# Patient Record
Sex: Female | Born: 1990 | Race: White | Hispanic: No | Marital: Single | Smoking: Never smoker
Health system: Southern US, Community
[De-identification: ages and names within clinical notes are randomized; demographics above are authoritative.]

## PROBLEM LIST (undated history)

## (undated) DIAGNOSIS — N390 Urinary tract infection, site not specified: Secondary | ICD-10-CM

## (undated) DIAGNOSIS — K921 Melena: Secondary | ICD-10-CM

## (undated) DIAGNOSIS — R519 Headache, unspecified: Secondary | ICD-10-CM

## (undated) DIAGNOSIS — R51 Headache: Secondary | ICD-10-CM

## (undated) DIAGNOSIS — G43909 Migraine, unspecified, not intractable, without status migrainosus: Secondary | ICD-10-CM

## (undated) HISTORY — DX: Migraine, unspecified, not intractable, without status migrainosus: G43.909

## (undated) HISTORY — DX: Urinary tract infection, site not specified: N39.0

## (undated) HISTORY — PX: WISDOM TOOTH EXTRACTION: SHX21

## (undated) HISTORY — DX: Melena: K92.1

## (undated) HISTORY — DX: Headache: R51

## (undated) HISTORY — DX: Headache, unspecified: R51.9

---

## 2015-11-27 ENCOUNTER — Telehealth: Payer: Self-pay | Admitting: *Deleted

## 2015-11-27 NOTE — Telephone Encounter (Signed)
Unable to reach patient at time of Pre-Visit Call. Line busy, unable to leave message. 

## 2015-11-28 ENCOUNTER — Ambulatory Visit (INDEPENDENT_AMBULATORY_CARE_PROVIDER_SITE_OTHER): Payer: Managed Care, Other (non HMO) | Admitting: Family Medicine

## 2015-11-28 ENCOUNTER — Encounter: Payer: Self-pay | Admitting: Family Medicine

## 2015-11-28 VITALS — BP 120/63 | HR 89 | Temp 98.6°F | Ht 65.0 in | Wt 225.4 lb

## 2015-11-28 DIAGNOSIS — H9319 Tinnitus, unspecified ear: Secondary | ICD-10-CM | POA: Insufficient documentation

## 2015-11-28 DIAGNOSIS — Z1329 Encounter for screening for other suspected endocrine disorder: Secondary | ICD-10-CM | POA: Diagnosis not present

## 2015-11-28 DIAGNOSIS — H93A9 Pulsatile tinnitus, unspecified ear: Secondary | ICD-10-CM

## 2015-11-28 DIAGNOSIS — Z13220 Encounter for screening for lipoid disorders: Secondary | ICD-10-CM | POA: Diagnosis not present

## 2015-11-28 DIAGNOSIS — R1012 Left upper quadrant pain: Secondary | ICD-10-CM | POA: Diagnosis not present

## 2015-11-28 DIAGNOSIS — H9311 Tinnitus, right ear: Secondary | ICD-10-CM

## 2015-11-28 DIAGNOSIS — E663 Overweight: Secondary | ICD-10-CM

## 2015-11-28 DIAGNOSIS — Z130 Encounter for screening for diseases of the blood and blood-forming organs and certain disorders involving the immune mechanism: Secondary | ICD-10-CM

## 2015-11-28 DIAGNOSIS — Z131 Encounter for screening for diabetes mellitus: Secondary | ICD-10-CM

## 2015-11-28 NOTE — Patient Instructions (Addendum)
I will refer you to ENT to look at the ringing in your ears. Also we will check labs for you today- I will be in touch with your results asap.  It is ok to start an exercise program but start slow and easy until you gain fitness!

## 2015-11-28 NOTE — Progress Notes (Signed)
Pre visit review using our clinic review tool, if applicable. No additional management support is needed unless otherwise documented below in the visit note. 

## 2015-11-28 NOTE — Progress Notes (Signed)
[facility] [address], [address] [place], [place] [id number] [phone]  Date:  11/28/2015   Name:  Joanna Pena Orf   DOB:  07/19/1990   MRN:  [id number]  PCP:  Abbe AmsterdamOPLAND,JESSICA, MD    Chief Complaint: Establish Care   History of Present Illness:  Joanna Pena Corman is a 25 y.o. very pleasant female patient who presents with the following: She would like to establish care- she had not seen a doctor in a long time.   She wants to "get healthy and start exercising" and wants to be sure all is well first   She has noted a ringing in her right ear for 6 months and now a whooshing sound that pulses in the right ear for 4-5 months.     She has had "stomach issues" for 8 years, dx with a hiatal hernia at some point in the past. .  4-5 years ago she had some labs and an US but is not sure of the results.  This was done in [place]. Her sx have gotten better over the last couple of years. She describes a hard feeling in her LUQ after eating some of the time. It is not present now and has improved over the last 2 years.   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    She has been otherwise generally healthy   No recent labs.   She does notice periodic  GERD- she will use tums OTC prn She has not yet had a pap- but she is a virgin and does not feel all that comfortable having the pap done Family history of thyroid issues   There are no active problems to display for this patient.   No past medical history on file.  No past surgical history on file.  Social History  Substance Use Topics  . Smoking status: Not on file  . Smokeless tobacco: Not on file  . Alcohol Use: Not on file    No family history on file.  Allergies not on file  Medication list has been reviewed and updated.  No current outpatient  prescriptions on file prior to visit.   No current facility-administered medications on file prior to visit.    Review of Systems:  As per HPI- otherwise negative.   Physical Examination: Filed Vitals:   11/28/15 1549  BP: 120/63  Pulse: 89  Temp: 98.6 F (37 C)   Filed Vitals:   11/28/15 1549  Height: 5\' 5"  (1.651 m)  Weight: 225 lb 6.4 oz (102.241 kg)   Body mass index is 37.51 kg/(m^2). Ideal Body Weight: Weight in (lb) to have BMI = 25: 149.9  GEN: WDWN, NAD, Non-toxic, A & O x 3, obese, looks well HEENT: Atraumatic, Normocephalic. Neck supple. No masses, No LAD.  Bilateral TM wnl, oropharynx normal.  PEERL,EOMI.   Ears and Nose: No external deformity. CV: RRR, No M/G/R. No JVD. No thrill. No extra heart sounds. PULM: CTA B, no wheezes, crackles, rhonchi. No retractions. No resp. distress. No accessory muscle use. ABD: S, NT, ND, +BS. No rebound. No HSM.  Benign belly EXTR: No c/c/e NEURO Normal gait.  PSYCH: Normally interactive. Conversant. Not depressed or anxious appearing.  Calm demeanor.    Assessment and Plan: Pulsatile tinnitus - Plan: Ambulatory referral to  ENT  Screening for hyperlipidemia - Plan: Lipid panel  Screening for thyroid disorder - Plan: TSH  Screening for deficiency anemia - Plan: CBC  Screening for diabetes mellitus - Plan: Comprehensive metabolic panel, Hemoglobin A1c  LUQ pain - Plan: Lipase  Labs pending as above Discussed pap- she is not SA so the benefit to her is less certain but we do not have recommendations for women over 21 who are virginal.  Would recommend that we try to do a pap for her this year and she will think about it Referral to ENT for her tinnitus Labs pending as above   Signed Abbe AmsterdamJessica Copland, MD

## 2015-11-29 LAB — LIPID PANEL
CHOL/HDL RATIO: 5
CHOLESTEROL: 205 mg/dL — AB (ref 0–200)
HDL: 40.3 mg/dL (ref >39.00)
LDL CALC: 127 mg/dL — AB (ref 0–99)
NonHDL: 164.29
TRIGLYCERIDES: 184 mg/dL — AB (ref 0.0–149.0)
VLDL: 36.8 mg/dL (ref 0.0–40.0)

## 2015-11-29 LAB — COMPREHENSIVE METABOLIC PANEL
ALT: 27 U/L (ref 0–35)
AST: 18 U/L (ref 0–37)
Albumin: 4.5 g/dL (ref 3.5–5.2)
Alkaline Phosphatase: 54 U/L (ref 39–117)
BUN: 11 mg/dL (ref 6–23)
CALCIUM: 9.6 mg/dL (ref 8.4–10.5)
CHLORIDE: 102 meq/L (ref 96–112)
CO2: 29 meq/L (ref 19–32)
CREATININE: 0.84 mg/dL (ref 0.40–1.20)
GFR: 86.98 mL/min (ref >60.00)
GLUCOSE: 75 mg/dL (ref 70–99)
Potassium: 3.6 mEq/L (ref 3.5–5.1)
Sodium: 136 mEq/L (ref 135–145)
Total Bilirubin: 0.6 mg/dL (ref 0.2–1.2)
Total Protein: 7.4 g/dL (ref 6.0–8.3)

## 2015-11-29 LAB — CBC
HCT: 41.1 % (ref 36.0–46.0)
Hemoglobin: 13.8 g/dL (ref 12.0–15.0)
MCHC: 33.5 g/dL (ref 30.0–36.0)
MCV: 91 fl (ref 78.0–100.0)
PLATELETS: 301 10*3/uL (ref 150.0–400.0)
RBC: 4.52 Mil/uL (ref 3.87–5.11)
RDW: 12.9 % (ref 11.5–15.5)
WBC: 10 10*3/uL (ref 4.0–10.5)

## 2015-11-29 LAB — HEMOGLOBIN A1C: Hgb A1c MFr Bld: 4.9 % (ref 4.6–6.5)

## 2015-11-29 LAB — TSH: TSH: 1.51 u[IU]/mL (ref 0.35–4.50)

## 2015-11-29 LAB — LIPASE: Lipase: 35 U/L (ref 11.0–59.0)

## 2015-12-01 ENCOUNTER — Encounter: Payer: Self-pay | Admitting: Family Medicine

## 2017-05-13 ENCOUNTER — Encounter: Payer: Self-pay | Admitting: Family Medicine

## 2017-05-13 ENCOUNTER — Ambulatory Visit: Payer: Managed Care, Other (non HMO) | Admitting: Family Medicine

## 2017-05-13 DIAGNOSIS — S82899A Other fracture of unspecified lower leg, initial encounter for closed fracture: Secondary | ICD-10-CM | POA: Insufficient documentation

## 2017-05-13 DIAGNOSIS — S82892A Other fracture of left lower leg, initial encounter for closed fracture: Secondary | ICD-10-CM | POA: Diagnosis not present

## 2017-05-13 NOTE — Assessment & Plan Note (Signed)
Had an inversion injury.  - placed in an aircast today  - can follow up in 2-3 weeks. Can transition to ASO and begin mobilization exercises as her symptoms allow.

## 2017-05-13 NOTE — Progress Notes (Signed)
Joanna Pena - 26 y.o. female MRN [id number]  Date of birth: 12/17/1990  SUBJECTIVE:  Including CC & ROS.  Chief Complaint  Patient presents with  . Ankle Pain    Joanna Pena is a 26 y.o. female that is here today for an evaluation of left ankle pain. Patient fell two days ago coming down the stairs and landed on her ankle. Patient is described as achy tenderness. [facility]. Patient was placed in an ankle brace. Admits swollen. Patient has been applying ice and taking motrin. She has started having some bruising over the lateral aspect of the ankle. Pain is occurring on the lateral aspect the ankle. Having some swelling there as well. Pain is mild to moderate in nature.  Review of the x-ray from yesterday shows a subtle avulsion fracture at the tip of lateral malleolus.   Review of Systems  Constitutional: Negative for fever.  Respiratory: Negative for shortness of breath.   Cardiovascular: Negative for chest pain.  Musculoskeletal: Positive for gait problem.  Skin: Positive for color change.  Neurological: Negative for weakness and numbness.  Hematological: Negative for adenopathy.  Psychiatric/Behavioral: Negative for agitation.    HISTORY: Past Medical, Surgical, Social, and Family History Reviewed & Updated per EMR.   Pertinent Historical Findings include:  Past Medical History:  Diagnosis Date  . Blood in stool   . Frequent headaches   . Migraines   . Urinary tract infection     Past Surgical History:  Procedure Laterality Date  . WISDOM TOOTH EXTRACTION      No Known Allergies  Family History  Problem Relation Age of Onset  . Alcoholism Father   . Arthritis Father   . Hypertension Father   . Arthritis Paternal Grandfather   . Hypertension Paternal Grandfather   . Diabetes Paternal Grandfather   . Arthritis Paternal Grandmother   . Diabetes Maternal Grandfather   . Diabetes Maternal Grandmother      Social  History   Socioeconomic History  . Marital status: Single    Spouse name: Not on file  . Number of children: Not on file  . Years of education: Not on file  . Highest education level: Not on file  Social Needs  . Financial resource strain: Not on file  . Food insecurity - worry: Not on file  . Food insecurity - inability: Not on file  . Transportation needs - medical: Not on file  . Transportation needs - non-medical: Not on file  Occupational History  . Not on file  Tobacco Use  . Smoking status: Never Smoker  . Smokeless tobacco: Never Used  Substance and Sexual Activity  . Alcohol use: Yes    Alcohol/week: 0.0 oz  . Drug use: No  . Sexual activity: Not on file  Other Topics Concern  . Not on file  Social History Narrative  . Not on file     PHYSICAL EXAM:  VS: BP 132/78 (BP Location: Left Arm, Patient Position: Sitting, Cuff Size: Normal)   Pulse 74   Temp 98.1 F (36.7 C) (Oral)   Ht 5\' 5"  (1.651 m)   Wt 225 lb (102.1 kg)   SpO2 99%   BMI 37.44 kg/m  Physical Exam Gen: NAD, alert, cooperative with exam, well-appearing ENT: normal lips, normal nasal mucosa,  Eye: normal EOM, normal conjunctiva and lids CV:  no edema, +2 pedal pulses   Resp: no accessory muscle use, non-labored,  GI: no masses  or tenderness, no hernia  Skin: no rashes, no areas of induration  Neuro: normal tone, normal sensation to touch Psych:  normal insight, alert and oriented MSK:  Left ankle:  Minimal swelling and ecchymosis over the lateral malleolus and lateral aspect of the hindfoot Full in plantarflexion, dorsiflexion, inversion, and eversion of the foot; flexion and extension of the toes Strength: 5/5 in all directions. Sensation: intact Vascular: intact w/ dorsalis pedis & posterior tibialis pulses 2+ Stable lateral and medial ligaments; Negative Anterior drawer test Normal gait Neurovascular intact  Limited ultrasound: Left ankle:  Lesion of the lateral malleolus observed  in the long axis. Soft tissue swelling is some swelling of the joint line is appreciated  Summary: Avulsion fracture of the lateral malleolus.  Ultrasound and interpretation by Clare GandyJeremy Keshona Kartes, MD           ASSESSMENT & PLAN:   Avulsion fracture of ankle Had an inversion injury.  - placed in an aircast today  - can follow up in 2-3 weeks. Can transition to ASO and begin mobilization exercises as her symptoms allow.

## 2017-06-06 NOTE — Progress Notes (Addendum)
[facility]care at [facility] [address] [place], [place] [address] [id number] [phone]  Date:  06/09/2017   Name:  Joanna Pena   DOB:  1991-05-24   MRN:  [id number]  PCP:  Pearline Cables, MD    Chief Complaint: Annual Exam   History of Present Illness:  Joanna Pena is a 27 y.o. very pleasant female patient who presents with the following:  Here today for a CPE I last saw her in July of 2017: She would like to establish care- she had not seen a doctor in a long time.   She wants to "get healthy and start exercising" and wants to be sure all is well first   She has noted a ringing in her right ear for 6 months and now a whooshing sound that pulses in the right ear for 4-5 months.     She has had "stomach issues" for 8 years, dx with a hiatal hernia at some point in the past. .  4-5 years ago she had some labs and an [place] but is not sure of the results.  This was done in [place]. Her sx have gotten better over the last couple of years. She describes a hard feeling in her LUQ after eating some of the time. It is not present now and has improved over the last 2 years.   She has regular bowels unless she drinks too much caffeine  She works at the [employer], and part time at [employer], and she is doing Arboriculturist in Advertising copywriter.   She does notice periodic  GERD- she will use tums OTC prn She has not yet had a pap- but she is a virgin and does not feel all that comfortable having the pap done Family history of thyroid issues   Needs labs today ?pap- not done yet Flu: done Tetanus:  She is still working at the [employer], she is planning to go back to school for her masters in [employer]  She is fasting today Her tetanus is UTD she thinks  She is not yet SA and does not want to have her pap today- she might like to try to do this in a month or so She does use tampons and does ok with these   She notes that over the  last few months she had had more reflux- mostly in the am.  She will have a burning sensation in her throat and it can taste strange.   She is not taking any meds for this except she will take some OTC meds on occasion- an acid reducer but she is not sure what type  She feels like there is "something there" in her LUQ. Feels like a small bump. This will come and go, it is more palpable to her certain days or when her sx are worse  Not really painful, it feels "like a giant gas bubble" to her No vomiting  Her sx are worse after certain food such as chocolate, spicy foods Also not eating on a regular basis   She notes concern of possible PMDD- over the last year or so she had noted that the week prior to her menses shew will feel upset, irritable,and will have more breast tenderness  The rest of the month she feels ok   She is not a smoker, she has never had any cancer or a blood clot in her leg or her  lung She does have history of migraine but no aura.    LMP was 12/20  Patient Active Problem List   Diagnosis Date Noted  . Avulsion fracture of ankle 05/13/2017  . Patient overweight 11/28/2015  . Tinnitus 11/28/2015    Past Medical History:  Diagnosis Date  . Blood in stool   . Frequent headaches   . Migraines   . Urinary tract infection     Past Surgical History:  Procedure Laterality Date  . WISDOM TOOTH EXTRACTION      Social History   Tobacco Use  . Smoking status: Never Smoker  . Smokeless tobacco: Never Used  Substance Use Topics  . Alcohol use: Yes    Alcohol/week: 0.0 oz  . Drug use: No    Family History  Problem Relation Age of Onset  . Alcoholism Father   . Arthritis Father   . Hypertension Father   . Arthritis Paternal Grandfather   . Hypertension Paternal Grandfather   . Diabetes Paternal Grandfather   . Arthritis Paternal Grandmother   . Diabetes Maternal Grandfather   . Diabetes Maternal Grandmother     No Known Allergies  Medication list has  been reviewed and updated.  Current Outpatient Medications on File Prior to Visit  Medication Sig Dispense Refill  . ibuprofen (ADVIL,MOTRIN) 200 MG tablet Take 200 mg by mouth every 6 (six) hours as needed.     No current facility-administered medications on file prior to visit.     Review of Systems:  As per HPI- otherwise negative.   Physical Examination: Vitals:   06/09/17 1004  BP: 128/80  Pulse: 87  Resp: 16  Temp: 98.2 F (36.8 C)  SpO2: 99%   Vitals:   06/09/17 1004  Weight: 234 lb 3.2 oz (106.2 kg)  Height: 5\' 5"  (1.651 m)   Body mass index is 38.97 kg/m. Ideal Body Weight: Weight in (lb) to have BMI = 25: 149.9  GEN: WDWN, NAD, Non-toxic, A & O x 3, looks well, obese HEENT: Atraumatic, Normocephalic. Neck supple. No masses, No LAD.  Bilateral TM wnl, oropharynx normal.  PEERL,EOMI.   Ears and Nose: No external deformity. CV: RRR, No M/G/R. No JVD. No thrill. No extra heart sounds. PULM: CTA B, no wheezes, crackles, rhonchi. No retractions. No resp. distress. No accessory muscle use. ABD: S, NT, ND, +BS. No rebound. No HSM. EXTR: No c/c/e NEURO Normal gait.  PSYCH: Normally interactive. Conversant. Not depressed or anxious appearing.  Calm demeanor.  abd is benign with pt supine. There is a subtle firm area under the left ribs when she is standing- ?abdomianl wall defect/ minor hernia    Assessment and Plan: Physical exam  Encounter for initial prescription of contraceptive pills - Plan: POCT urine pregnancy  Screening for deficiency anemia - Plan: CBC  Screening for diabetes mellitus - Plan: Comprehensive metabolic panel, Hemoglobin A1c  Screening for hyperlipidemia - Plan: Lipid panel  Gastroesophageal reflux disease, esophagitis presence not specified - Plan: Comprehensive metabolic panel, H. pylori breath test  Abdominal mass, LUQ (left upper quadrant) - Plan: US Abdomen Complete  Planned to do HCG but pt is unable to void- she is absolutely  certain that she is not pregnant as she has NEVER been SA She would like to start on OCP for PMDD sx- this is fine, went over how to start pills. If not helpful or if her headaches get worse she will alert me Will plan further follow- up pending labs. Plan for abd  us She plans to come in for a pap soon as well   Signed Abbe AmsterdamJessica Copland, MD  Received her labs- message to pt Results for orders placed or performed in visit on 06/09/17  CBC  Result Value Ref Range   WBC 9.2 4.0 - 10.5 K/uL   RBC 4.79 3.87 - 5.11 Mil/uL   Platelets 309.0 150.0 - 400.0 K/uL   Hemoglobin 14.8 12.0 - 15.0 g/dL   HCT 40.943.7 81.136.0 - 91.446.0 %   MCV 91.3 78.0 - 100.0 fl   MCHC 33.9 30.0 - 36.0 g/dL   RDW 78.212.9 95.611.5 - 21.315.5 %  Comprehensive metabolic panel  Result Value Ref Range   Sodium 136 135 - 145 mEq/L   Potassium 4.3 3.5 - 5.1 mEq/L   Chloride 101 96 - 112 mEq/L   CO2 29 19 - 32 mEq/L   Glucose, Bld 94 70 - 99 mg/dL   BUN 10 6 - 23 mg/dL   Creatinine, Ser 0.860.88 0.40 - 1.20 mg/dL   Total Bilirubin 1.0 0.2 - 1.2 mg/dL   Alkaline Phosphatase 54 39 - 117 U/L   AST 16 0 - 37 U/L   ALT 23 0 - 35 U/L   Total Protein 7.5 6.0 - 8.3 g/dL   Albumin 4.9 3.5 - 5.2 g/dL   Calcium 9.2 8.4 - 57.810.5 mg/dL   GFR 46.9681.49 >29.52>60.00 mL/min  Hemoglobin A1c  Result Value Ref Range   Hgb A1c MFr Bld 5.0 4.6 - 6.5 %  Lipid panel  Result Value Ref Range   Cholesterol 195 0 - 200 mg/dL   Triglycerides 841.3197.0 (H) 0.0 - 149.0 mg/dL   HDL 24.4034.60 (L) >10.27>39.00 mg/dL   VLDL 25.339.4 0.0 - 66.440.0 mg/dL   LDL Cholesterol 403121 (H) 0 - 99 mg/dL   Total CHOL/HDL Ratio 6    NonHDL 160.37   H. pylori breath test  Result Value Ref Range   H. pylori Breath Test NOT DETECTED NOT DETECT

## 2017-06-09 ENCOUNTER — Ambulatory Visit (INDEPENDENT_AMBULATORY_CARE_PROVIDER_SITE_OTHER): Payer: Managed Care, Other (non HMO) | Admitting: Family Medicine

## 2017-06-09 ENCOUNTER — Encounter: Payer: Self-pay | Admitting: Family Medicine

## 2017-06-09 VITALS — BP 128/80 | HR 87 | Temp 98.2°F | Resp 16 | Ht 65.0 in | Wt 234.2 lb

## 2017-06-09 DIAGNOSIS — Z30011 Encounter for initial prescription of contraceptive pills: Secondary | ICD-10-CM

## 2017-06-09 DIAGNOSIS — Z131 Encounter for screening for diabetes mellitus: Secondary | ICD-10-CM | POA: Diagnosis not present

## 2017-06-09 DIAGNOSIS — K219 Gastro-esophageal reflux disease without esophagitis: Secondary | ICD-10-CM | POA: Diagnosis not present

## 2017-06-09 DIAGNOSIS — Z13220 Encounter for screening for lipoid disorders: Secondary | ICD-10-CM

## 2017-06-09 DIAGNOSIS — Z130 Encounter for screening for diseases of the blood and blood-forming organs and certain disorders involving the immune mechanism: Secondary | ICD-10-CM | POA: Diagnosis not present

## 2017-06-09 DIAGNOSIS — R1902 Left upper quadrant abdominal swelling, mass and lump: Secondary | ICD-10-CM

## 2017-06-09 DIAGNOSIS — Z0000 Encounter for general adult medical examination without abnormal findings: Secondary | ICD-10-CM

## 2017-06-09 DIAGNOSIS — Z0001 Encounter for general adult medical examination with abnormal findings: Secondary | ICD-10-CM | POA: Diagnosis not present

## 2017-06-09 LAB — COMPREHENSIVE METABOLIC PANEL
ALT: 23 U/L (ref 0–35)
AST: 16 U/L (ref 0–37)
Albumin: 4.9 g/dL (ref 3.5–5.2)
Alkaline Phosphatase: 54 U/L (ref 39–117)
BILIRUBIN TOTAL: 1 mg/dL (ref 0.2–1.2)
BUN: 10 mg/dL (ref 6–23)
CALCIUM: 9.2 mg/dL (ref 8.4–10.5)
CO2: 29 meq/L (ref 19–32)
Chloride: 101 mEq/L (ref 96–112)
Creatinine, Ser: 0.88 mg/dL (ref 0.40–1.20)
GFR: 81.49 mL/min (ref >60.00)
Glucose, Bld: 94 mg/dL (ref 70–99)
POTASSIUM: 4.3 meq/L (ref 3.5–5.1)
Sodium: 136 mEq/L (ref 135–145)
Total Protein: 7.5 g/dL (ref 6.0–8.3)

## 2017-06-09 LAB — CBC
HCT: 43.7 % (ref 36.0–46.0)
HEMOGLOBIN: 14.8 g/dL (ref 12.0–15.0)
MCHC: 33.9 g/dL (ref 30.0–36.0)
MCV: 91.3 fl (ref 78.0–100.0)
PLATELETS: 309 10*3/uL (ref 150.0–400.0)
RBC: 4.79 Mil/uL (ref 3.87–5.11)
RDW: 12.9 % (ref 11.5–15.5)
WBC: 9.2 10*3/uL (ref 4.0–10.5)

## 2017-06-09 LAB — LIPID PANEL
CHOL/HDL RATIO: 6
Cholesterol: 195 mg/dL (ref 0–200)
HDL: 34.6 mg/dL — AB (ref >39.00)
LDL CALC: 121 mg/dL — AB (ref 0–99)
NONHDL: 160.37
Triglycerides: 197 mg/dL — ABNORMAL HIGH (ref 0.0–149.0)
VLDL: 39.4 mg/dL (ref 0.0–40.0)

## 2017-06-09 LAB — HEMOGLOBIN A1C: Hgb A1c MFr Bld: 5 % (ref 4.6–6.5)

## 2017-06-09 MED ORDER — LEVONORGESTREL-ETHINYL ESTRAD 0.1-20 MG-MCG PO TABS: 1.0000 | ORAL_TABLET | Freq: Every day | ORAL | 4 refills | Status: DC

## 2017-06-09 NOTE — Patient Instructions (Addendum)
It was very nice to see you today!  Please come in for your pap when you feel comfortable  I will be in touch with your labs  Start on your birth control pill after your upcoming period.  Take 1 pill a day, about the same time every day.  Within 2-3 months I hope that your PMDD will be improved- if this is not helping let me know and we can change your medication or add an SSRI  We will also order an abdominal ultrasound for you to check on the area you have noticed in your left upper abdomen   Health Maintenance, Female Adopting a healthy lifestyle and getting preventive care can go a long way to promote health and wellness. Talk with your health care provider about what schedule of regular examinations is right for you. This is a good chance for you to check in with your provider about disease prevention and staying healthy. In between checkups, there are plenty of things you can do on your own. Experts have done a lot of research about which lifestyle changes and preventive measures are most likely to keep you healthy. Ask your health care provider for more information. Weight and diet Eat a healthy diet  Be sure to include plenty of vegetables, fruits, low-fat dairy products, and lean protein.  Do not eat a lot of foods high in solid fats, added sugars, or salt.  Get regular exercise. This is one of the most important things you can do for your health. ? Most adults should exercise for at least 150 minutes each week. The exercise should increase your heart rate and make you sweat (moderate-intensity exercise). ? Most adults should also do strengthening exercises at least twice a week. This is in addition to the moderate-intensity exercise.  Maintain a healthy weight  Body mass index (BMI) is a measurement that can be used to identify possible weight problems. It estimates body fat based on height and weight. Your health care provider can help determine your BMI and help you achieve or  maintain a healthy weight.  For females 87 years of age and older: ? A BMI below 18.5 is considered underweight. ? A BMI of 18.5 to 24.9 is normal. ? A BMI of 25 to 29.9 is considered overweight. ? A BMI of 30 and above is considered obese.  Watch levels of cholesterol and blood lipids  You should start having your blood tested for lipids and cholesterol at 28 years of age, then have this test every 5 years.  You may need to have your cholesterol levels checked more often if: ? Your lipid or cholesterol levels are high. ? You are older than 28 years of age. ? You are at high risk for heart disease.  Cancer screening Lung Cancer  Lung cancer screening is recommended for adults 36-33 years old who are at high risk for lung cancer because of a history of smoking.  A yearly low-dose CT scan of the lungs is recommended for people who: ? Currently smoke. ? Have quit within the past 15 years. ? Have at least a 30-pack-year history of smoking. A pack year is smoking an average of one pack of cigarettes a day for 1 year.  Yearly screening should continue until it has been 15 years since you quit.  Yearly screening should stop if you develop a health problem that would prevent you from having lung cancer treatment.  Breast Cancer  Practice breast self-awareness. This means understanding how  your breasts normally appear and feel.  It also means doing regular breast self-exams. Let your health care provider know about any changes, no matter how small.  If you are in your 20s or 30s, you should have a clinical breast exam (CBE) by a health care provider every 1-3 years as part of a regular health exam.  If you are 73 or older, have a CBE every year. Also consider having a breast X-ray (mammogram) every year.  If you have a family history of breast cancer, talk to your health care provider about genetic screening.  If you are at high risk for breast cancer, talk to your health care  provider about having an MRI and a mammogram every year.  Breast cancer gene (BRCA) assessment is recommended for women who have family members with BRCA-related cancers. BRCA-related cancers include: ? Breast. ? Ovarian. ? Tubal. ? Peritoneal cancers.  Results of the assessment will determine the need for genetic counseling and BRCA1 and BRCA2 testing.  Cervical Cancer Your health care provider may recommend that you be screened regularly for cancer of the pelvic organs (ovaries, uterus, and vagina). This screening involves a pelvic examination, including checking for microscopic changes to the surface of your cervix (Pap test). You may be encouraged to have this screening done every 3 years, beginning at age 17.  For women ages 22-65, health care providers may recommend pelvic exams and Pap testing every 3 years, or they may recommend the Pap and pelvic exam, combined with testing for human papilloma virus (HPV), every 5 years. Some types of HPV increase your risk of cervical cancer. Testing for HPV may also be done on women of any age with unclear Pap test results.  Other health care providers may not recommend any screening for nonpregnant women who are considered low risk for pelvic cancer and who do not have symptoms. Ask your health care provider if a screening pelvic exam is right for you.  If you have had past treatment for cervical cancer or a condition that could lead to cancer, you need Pap tests and screening for cancer for at least 20 years after your treatment. If Pap tests have been discontinued, your risk factors (such as having a new sexual partner) need to be reassessed to determine if screening should resume. Some women have medical problems that increase the chance of getting cervical cancer. In these cases, your health care provider may recommend more frequent screening and Pap tests.  Colorectal Cancer  This type of cancer can be detected and often prevented.  Routine  colorectal cancer screening usually begins at 28 years of age and continues through 28 years of age.  Your health care provider may recommend screening at an earlier age if you have risk factors for colon cancer.  Your health care provider may also recommend using home test kits to check for hidden blood in the stool.  A small camera at the end of a tube can be used to examine your colon directly (sigmoidoscopy or colonoscopy). This is done to check for the earliest forms of colorectal cancer.  Routine screening usually begins at age 62.  Direct examination of the colon should be repeated every 5-10 years through 28 years of age. However, you may need to be screened more often if early forms of precancerous polyps or small growths are found.  Skin Cancer  Check your skin from head to toe regularly.  Tell your health care provider about any new moles or changes  in moles, especially if there is a change in a mole's shape or color.  Also tell your health care provider if you have a mole that is larger than the size of a pencil eraser.  Always use sunscreen. Apply sunscreen liberally and repeatedly throughout the day.  Protect yourself by wearing long sleeves, pants, a wide-brimmed hat, and sunglasses whenever you are outside.  Heart disease, diabetes, and high blood pressure  High blood pressure causes heart disease and increases the risk of stroke. High blood pressure is more likely to develop in: ? People who have blood pressure in the high end of the normal range (130-139/85-89 mm Hg). ? People who are overweight or obese. ? People who are African American.  If you are 21-47 years of age, have your blood pressure checked every 3-5 years. If you are 76 years of age or older, have your blood pressure checked every year. You should have your blood pressure measured twice-once when you are at a hospital or clinic, and once when you are not at a hospital or clinic. Record the average of the  two measurements. To check your blood pressure when you are not at a hospital or clinic, you can use: ? An automated blood pressure machine at a pharmacy. ? A home blood pressure monitor.  If you are between 71 years and 42 years old, ask your health care provider if you should take aspirin to prevent strokes.  Have regular diabetes screenings. This involves taking a blood sample to check your fasting blood sugar level. ? If you are at a normal weight and have a low risk for diabetes, have this test once every three years after 28 years of age. ? If you are overweight and have a high risk for diabetes, consider being tested at a younger age or more often. Preventing infection Hepatitis B  If you have a higher risk for hepatitis B, you should be screened for this virus. You are considered at high risk for hepatitis B if: ? You were born in a country where hepatitis B is common. Ask your health care provider which countries are considered high risk. ? Your parents were born in a high-risk country, and you have not been immunized against hepatitis B (hepatitis B vaccine). ? You have HIV or AIDS. ? You use needles to inject street drugs. ? You live with someone who has hepatitis B. ? You have had sex with someone who has hepatitis B. ? You get hemodialysis treatment. ? You take certain medicines for conditions, including cancer, organ transplantation, and autoimmune conditions.  Hepatitis C  Blood testing is recommended for: ? Everyone born from [birth year] through 1966. ? Anyone with known risk factors for hepatitis C.  Sexually transmitted infections (STIs)  You should be screened for sexually transmitted infections (STIs) including gonorrhea and chlamydia if: ? You are sexually active and are younger than 28 years of age. ? You are older than 28 years of age and your health care provider tells you that you are at risk for this type of infection. ? Your sexual activity has changed since you  were last screened and you are at an increased risk for chlamydia or gonorrhea. Ask your health care provider if you are at risk.  If you do not have HIV, but are at risk, it may be recommended that you take a prescription medicine daily to prevent HIV infection. This is called pre-exposure prophylaxis (PrEP). You are considered at risk if: ? You  are sexually active and do not regularly use condoms or know the HIV status of your partner(s). ? You take drugs by injection. ? You are sexually active with a partner who has HIV.  Talk with your health care provider about whether you are at high risk of being infected with HIV. If you choose to begin PrEP, you should first be tested for HIV. You should then be tested every 3 months for as long as you are taking PrEP. Pregnancy  If you are premenopausal and you may become pregnant, ask your health care provider about preconception counseling.  If you may become pregnant, take 400 to 800 micrograms (mcg) of folic acid every day.  If you want to prevent pregnancy, talk to your health care provider about birth control (contraception). Osteoporosis and menopause  Osteoporosis is a disease in which the bones lose minerals and strength with aging. This can result in serious bone fractures. Your risk for osteoporosis can be identified using a bone density scan.  If you are 75 years of age or older, or if you are at risk for osteoporosis and fractures, ask your health care provider if you should be screened.  Ask your health care provider whether you should take a calcium or vitamin D supplement to lower your risk for osteoporosis.  Menopause may have certain physical symptoms and risks.  Hormone replacement therapy may reduce some of these symptoms and risks. Talk to your health care provider about whether hormone replacement therapy is right for you. Follow these instructions at home:  Schedule regular health, dental, and eye exams.  Stay current  with your immunizations.  Do not use any tobacco products including cigarettes, chewing tobacco, or electronic cigarettes.  If you are pregnant, do not drink alcohol.  If you are breastfeeding, limit how much and how often you drink alcohol.  Limit alcohol intake to no more than 1 drink per day for nonpregnant women. One drink equals 12 ounces of beer, 5 ounces of wine, or 1 ounces of hard liquor.  Do not use street drugs.  Do not share needles.  Ask your health care provider for help if you need support or information about quitting drugs.  Tell your health care provider if you often feel depressed.  Tell your health care provider if you have ever been abused or do not feel safe at home. This information is not intended to replace advice given to you by your health care provider. Make sure you discuss any questions you have with your health care provider. Document Released: 11/24/2010 Document Revised: 10/17/2015 Document Reviewed: 02/12/2015 Elsevier Interactive Patient Education  [employer].

## 2017-06-10 ENCOUNTER — Encounter: Payer: Self-pay | Admitting: Family Medicine

## 2017-06-10 ENCOUNTER — Ambulatory Visit (HOSPITAL_BASED_OUTPATIENT_CLINIC_OR_DEPARTMENT_OTHER)
Admission: RE | Admit: 2017-06-10 | Discharge: 2017-06-10 | Disposition: A | Discharge status: Home / Self Care | Payer: Managed Care, Other (non HMO) | Source: Ambulatory Visit | Attending: Family Medicine | Admitting: Family Medicine

## 2017-06-10 DIAGNOSIS — R1902 Left upper quadrant abdominal swelling, mass and lump: Secondary | ICD-10-CM | POA: Diagnosis present

## 2017-06-10 LAB — H. PYLORI BREATH TEST: H. PYLORI BREATH TEST: NOT DETECTED

## 2017-06-10 NOTE — Telephone Encounter (Unsigned)
Copied from CRM [id number]. Topic: General - Other >> Jun 10, 2017  5:06 PM Raquel SarnaHayes, Teresa G wrote: [facility]  She says she will do the ultrasound -  wanted to let Dr. Patsy Lageropland know that she cannot see the  hiatal hernia and cannot see or evaluate a mass if pt cannot feel it or tell where it's at.

## 2017-06-11 ENCOUNTER — Encounter: Payer: Self-pay | Admitting: Family Medicine

## 2018-04-20 IMAGING — US US ABDOMEN COMPLETE
1 series · 14 of 25 positions shown · non-contrast
Comparison: None.

CLINICAL DATA: Left upper quadrant discomfort for 2-3 years.

EXAM:
ABDOMEN ULTRASOUND COMPLETE

[Series 1: us abdomen complete · 0.25mm/px · 14 of 72 slices shown]
[im 1/72]
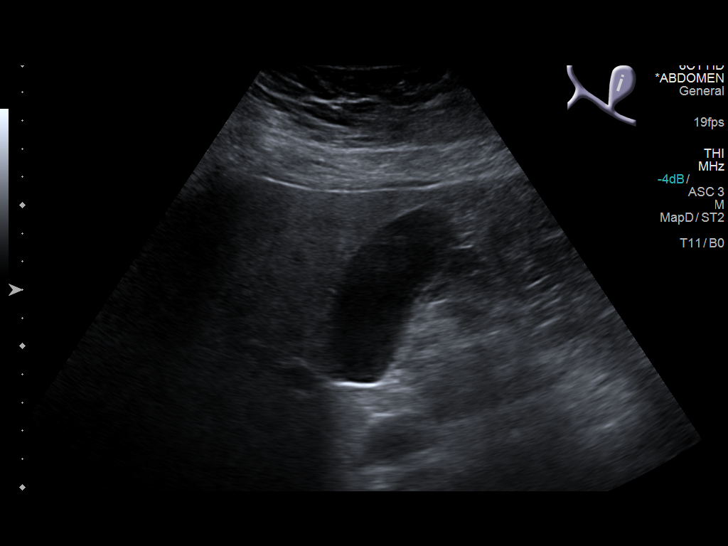
[im 6/72]
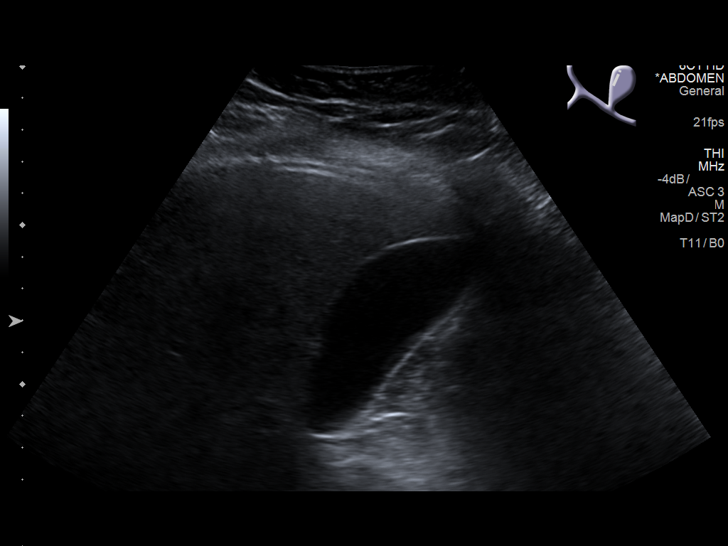
[im 12/72]
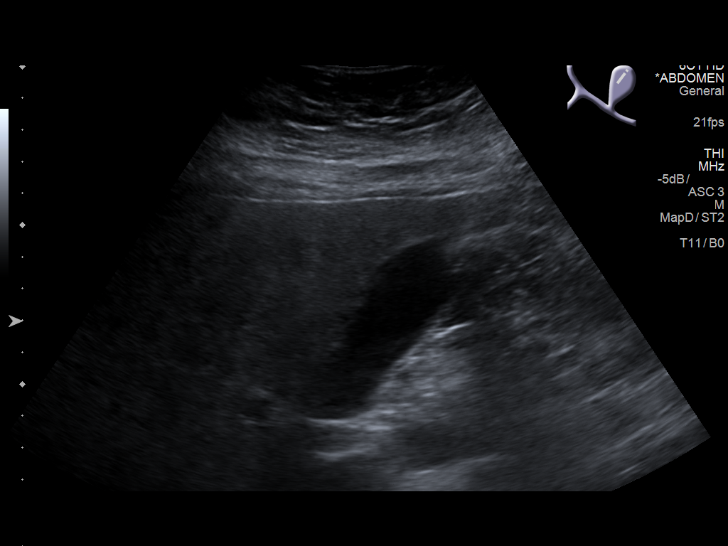
[im 18/72]
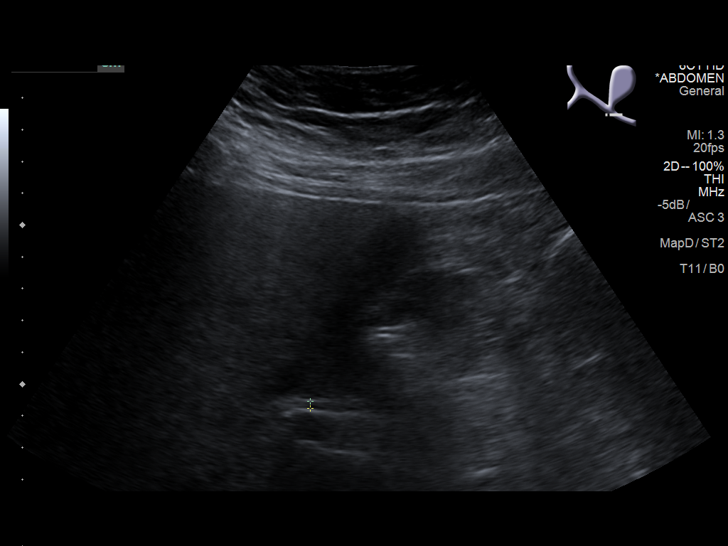
[im 24/72]
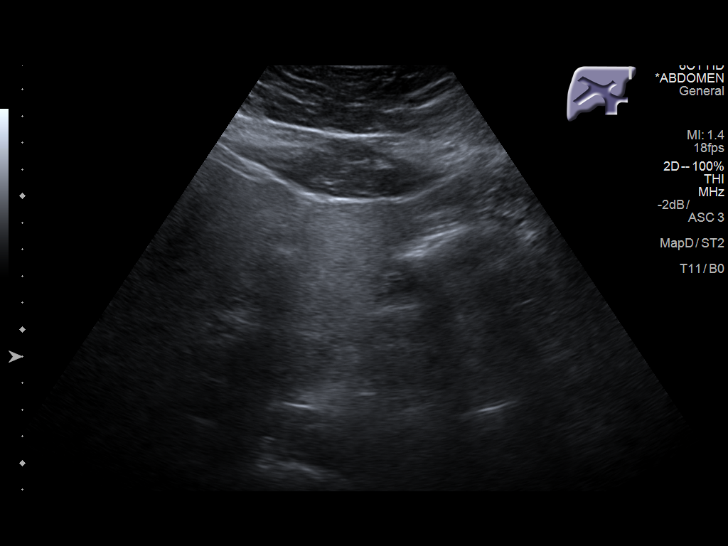
[im 27/72]
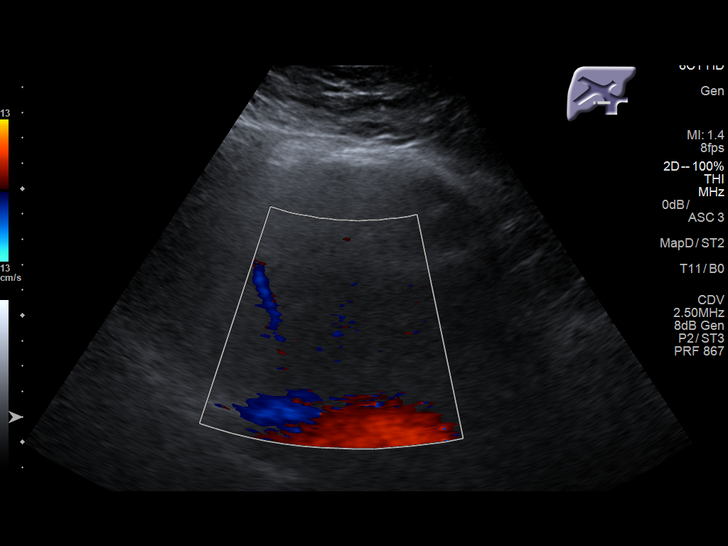
[im 33/72]
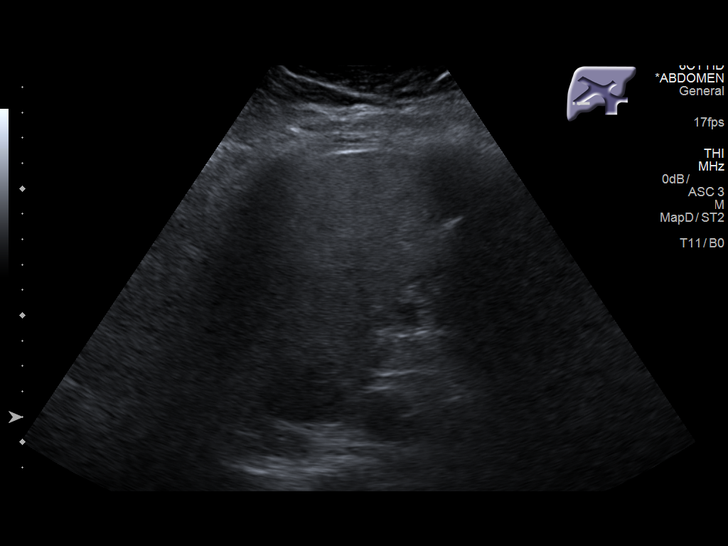
[im 39/72]
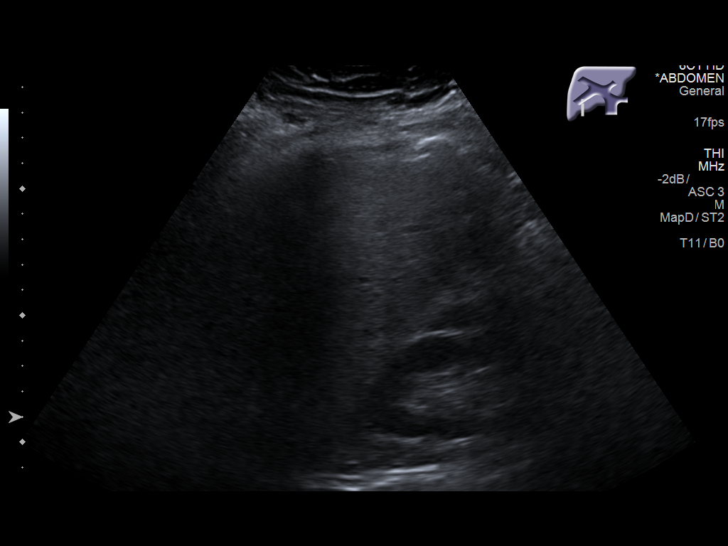
[im 45/72]
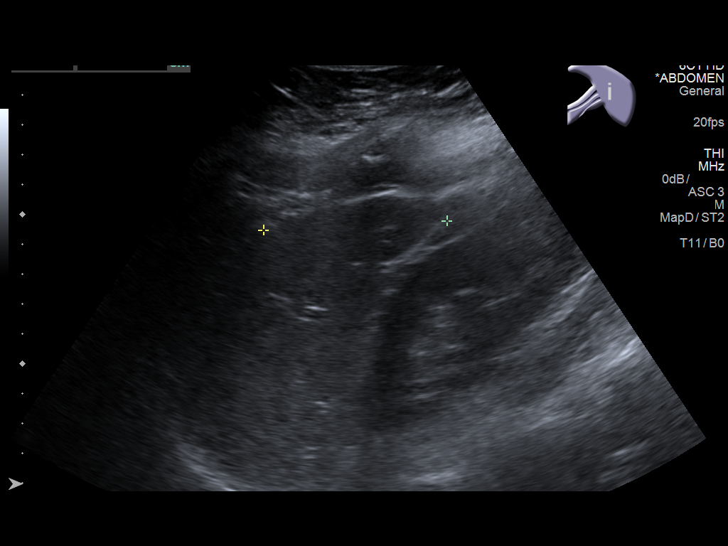
[im 48/72]
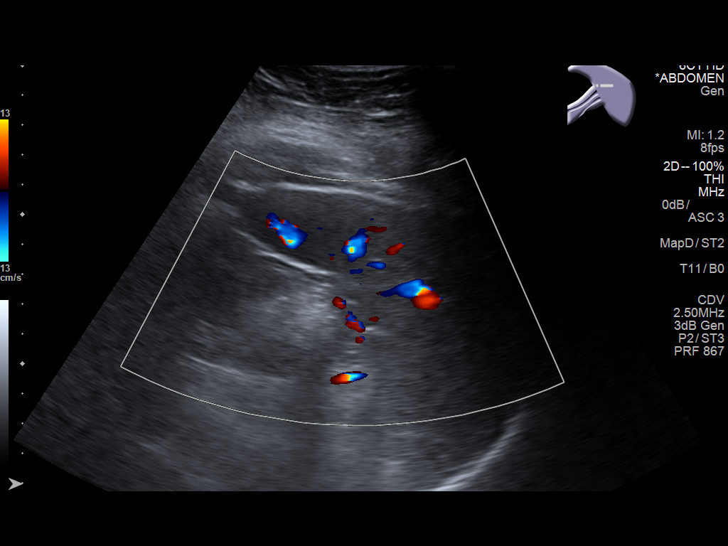
[im 54/72]
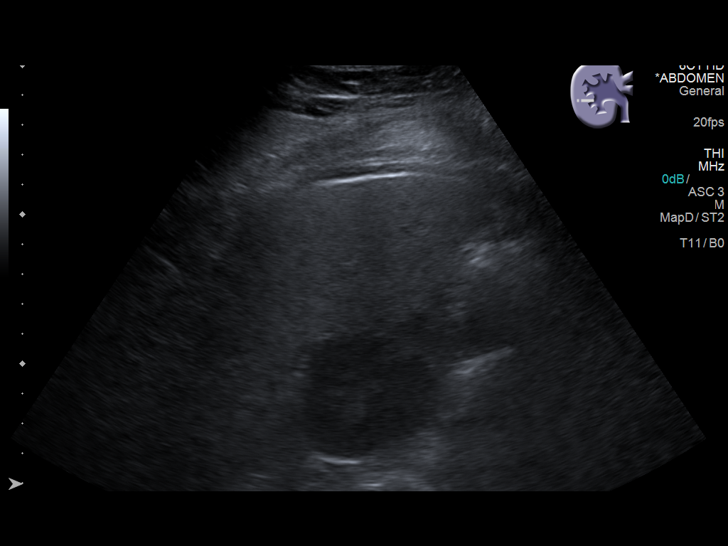
[im 60/72]
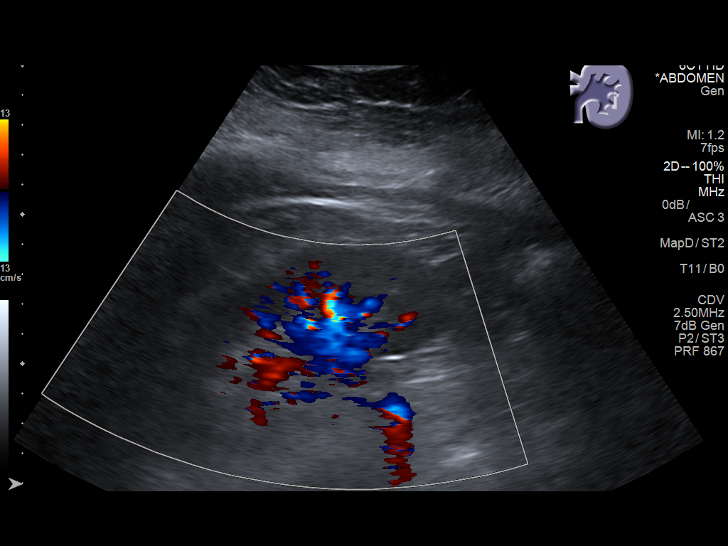
[im 66/72]
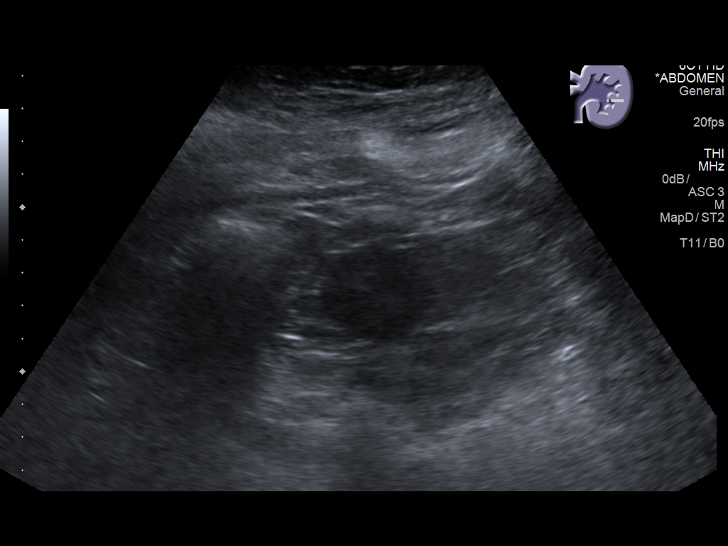
[im 72/72]
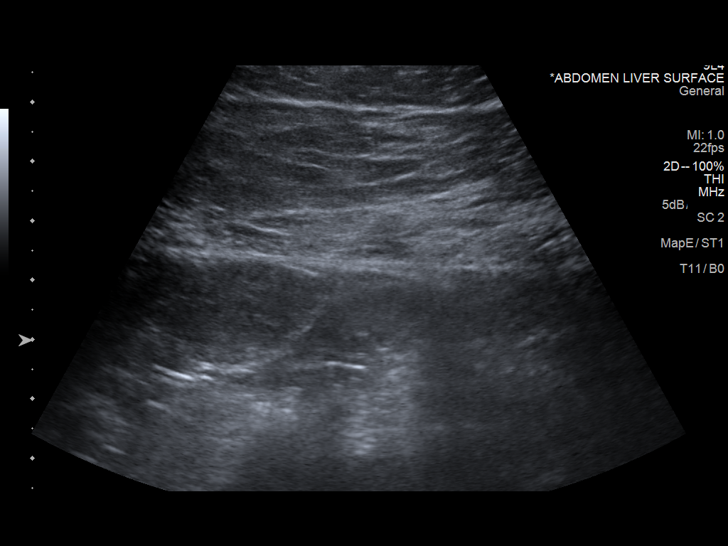

[14 of 25 positions shown; findings below may reference images not displayed]

FINDINGS: Gallbladder: No gallstones or wall thickening visualized. No
sonographic Murphy sign noted by sonographer.

Common bile duct: Diameter: 2 mm

Liver: No focal lesion identified. Within normal limits in
parenchymal echogenicity. Portal vein is patent on color Doppler
imaging with normal direction of blood flow towards the liver.

IVC: No abnormality visualized.

Pancreas: Visualized portion unremarkable.

Spleen: Size and appearance within normal limits.

Right Kidney: Length: 10.9 cm. Echogenicity within normal limits. No
mass or hydronephrosis visualized.

Left Kidney: Length: 12.3 cm. Echogenicity within normal limits. No
mass or hydronephrosis visualized.

Abdominal aorta: No aneurysm visualized.

Other findings: None.
IMPRESSION: Normal sonographic evaluation of the abdomen.

## 2018-05-26 ENCOUNTER — Ambulatory Visit: Payer: Managed Care, Other (non HMO) | Admitting: Family Medicine

## 2018-05-26 ENCOUNTER — Encounter: Payer: Self-pay | Admitting: Family Medicine

## 2018-05-26 VITALS — BP 130/80 | HR 80 | Temp 98.5°F | Resp 16 | Ht 65.0 in | Wt 236.0 lb

## 2018-05-26 DIAGNOSIS — J029 Acute pharyngitis, unspecified: Secondary | ICD-10-CM | POA: Diagnosis not present

## 2018-05-26 DIAGNOSIS — S0300XA Dislocation of jaw, unspecified side, initial encounter: Secondary | ICD-10-CM

## 2018-05-26 LAB — POCT RAPID STREP A (OFFICE): RAPID STREP A SCREEN: NEGATIVE

## 2018-05-26 MED ORDER — METHOCARBAMOL 500 MG PO TABS: 500.0000 mg | ORAL_TABLET | Freq: Three times a day (TID) | ORAL | 0 refills | Status: DC | PRN

## 2018-05-26 MED ORDER — AMOXICILLIN 500 MG PO CAPS: 1000.0000 mg | ORAL_CAPSULE | Freq: Two times a day (BID) | ORAL | 0 refills | Status: DC

## 2018-05-26 NOTE — Patient Instructions (Signed)
It was good to see you today, I am sorry you are having this your pain. At this point I do not see an obvious ear infection or throat infection.  We are going to try treating you for sinus infection with amoxicillin.  We will also try Robaxin, which is a muscle relaxer, as needed for TMJ pain. It is also okay to use ibuprofen, Tylenol, and Tums as needed. Please let me know if you are not feeling better the next few days, sooner if you start to get worse or have any change in your symptoms

## 2018-05-26 NOTE — Progress Notes (Signed)
[facility] [address], [address] [place], [place] [address] [phone] [phone]  Date:  05/26/2018   Name:  Joanna QuintMegan Puccinelli   DOB:  12/23/1990   MRN:  [id number]  PCP:  Pearline Cablesopland, Jessica C, MD    Chief Complaint: Ear Pain (since saturday, has tmj, headaches radiating behind bilateral ears, low grade fever 99.3)   History of Present Illness:  Joanna QuintMegan Kenagy is a 28 y.o. very pleasant female patient who presents with the following:  Generally healthy young lady here today for sick visit She does have TMJ, and notes that both ears have been hurting since Saturday ,and she has noted a headache in the back on her head and behind her ears She is not sure if her symptoms are due to TMJ or something else Her hearing seems to be about the same No drainage from her ears Her ears do itch She has noted a low grade temp this am only  No vomiting or diarrhea Occasional cough  She has started a new job just recently which has been stressful.  Last dose of ibuprofen yesterday, however it did not seem to help that much  She also mentions recurrence of her reflux symptoms recently She has an appointment to see me soon for a physical, we will discuss her reflux further at that time Patient Active Problem List   Diagnosis Date Noted  . Avulsion fracture of ankle 05/13/2017  . Patient overweight 11/28/2015  . Tinnitus 11/28/2015    Past Medical History:  Diagnosis Date  . Blood in stool   . Frequent headaches   . Migraines   . Urinary tract infection     Past Surgical History:  Procedure Laterality Date  . WISDOM TOOTH EXTRACTION      Social History   Tobacco Use  . Smoking status: Never Smoker  . Smokeless tobacco: Never Used  Substance Use Topics  . Alcohol use: Yes    Alcohol/week: 0.0 standard drinks  . Drug use: No    Family History  Problem Relation Age of Onset  . Alcoholism Father   . Arthritis Father   . Hypertension  Father   . Arthritis Paternal Grandfather   . Hypertension Paternal Grandfather   . Diabetes Paternal Grandfather   . Arthritis Paternal Grandmother   . Diabetes Maternal Grandfather   . Diabetes Maternal Grandmother     No Known Allergies  Medication list has been reviewed and updated.  Current Outpatient Medications on File Prior to Visit  Medication Sig Dispense Refill  . ibuprofen (ADVIL,MOTRIN) 200 MG tablet Take 200 mg by mouth every 6 (six) hours as needed.    Marland Kitchen. levonorgestrel-ethinyl estradiol (AVIANE,ALESSE,LESSINA) 0.1-20 MG-MCG tablet Take 1 tablet by mouth daily. 3 Package 4   No current facility-administered medications on file prior to visit.     Review of Systems:  As per HPI- otherwise negative. No vomiting or diarrhea  Physical Examination: Vitals:   05/26/18 1503  BP: 130/80  Pulse: 80  Resp: 16  Temp: 98.5 F (36.9 C)  SpO2: 99%   Vitals:   05/26/18 1503  Weight: 236 lb (107 kg)  Height: 5\' 5"  (1.651 m)   Body mass index is 39.27 kg/m. Ideal Body Weight: Weight in (lb) to have BMI = 25: 149.9  GEN: WDWN, NAD, Non-toxic, A & O x 3, obese, looks well  HEENT: Atraumatic, Normocephalic. Neck supple. No masses, No LAD.  Bilateral TM wnl, oropharynx  normal.  PEERL,EOMI.   Ears and throat appear normal. No tooth tenderness on exam. No cervical lymphadenopathy She does have some tenderness of her frontal sinuses to percussion Ears and Nose: No external deformity. CV: RRR, No M/G/R. No JVD. No thrill. No extra heart sounds. PULM: CTA B, no wheezes, crackles, rhonchi. No retractions. No resp. distress. No accessory muscle use. EXTR: No c/c/e NEURO Normal gait.  PSYCH: Normally interactive. Conversant. Not depressed or anxious appearing.  Calm demeanor.   Results for orders placed or performed in visit on 05/26/18  POCT rapid strep A  Result Value Ref Range   Rapid Strep A Screen Negative Negative    Assessment and Plan: Pharyngitis,  unspecified etiology - Plan: POCT rapid strep A, amoxicillin (AMOXIL) 500 MG capsule  Dislocation of temporomandibular joint, initial encounter - Plan: methocarbamol (ROBAXIN) 500 MG tablet  Here today with concern of bilateral ear pain.  Her ears appear normal, and she does not have strep throat.  We will treat her for a potential sinus infection with amoxicillin, and also provided Robaxin to use as needed for TMJ pain. She will let me know if not feeling better next few days, sooner if she is getting worse. We have an appointment coming up in a few weeks for physical  Signed Abbe AmsterdamJessica Copland, MD

## 2018-06-10 ENCOUNTER — Other Ambulatory Visit: Payer: Self-pay | Admitting: Family Medicine

## 2018-06-10 DIAGNOSIS — Z30011 Encounter for initial prescription of contraceptive pills: Secondary | ICD-10-CM

## 2018-06-13 ENCOUNTER — Encounter: Payer: Managed Care, Other (non HMO) | Admitting: Family Medicine

## 2018-07-16 NOTE — Progress Notes (Addendum)
[facility] [address] [place], [place] [address] [phone]  Date:  07/21/2018   Name:  Joanna Pena   DOB:  [birth year]   MRN:  [id number]  PCP:  Pearline Cables, MD    Chief Complaint: Annual Exam (pap smear)   History of Present Illness:  Joanna Pena is a 28 y.o. very pleasant female patient who presents with the following:  Generally healthy young woman here today for physical exam I saw her for sick visit in January, and for a physical about 1 year ago She works at the [employer], at [employer] location, and also working events at the [employer].  She is very busy  Pap: she is due, this will be her first pap She is a virgin but has used tampons to prepare for having a Pap Labs: Due for complete panel- she is fasting  Immunizations: flu is UTD Tetanus was within about 10 years. 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  She has had anxiety off and on since her middle school years, but notes it seems be getting worse.  She may have some mild depression, but this is not the main feature.  Certainly no suicidal ideation.  She notes that she may feel anxious 60 to 70% of the time.  Anxiety can be triggered by anything, even routine events like getting ready for work.  Her sister recently started on Effexor, and has responded very well to it.  Joanna Pena has not tried any medication for her anxiety so far.  She would be interested in trying Effexor  Patient Active Problem List   Diagnosis Date Noted  . Avulsion fracture of ankle 05/13/2017  . Patient overweight 11/28/2015  . Tinnitus 11/28/2015    Past Medical History:  Diagnosis Date  . Blood in stool   . Frequent headaches   . Migraines   . Urinary tract infection      Past Surgical History:  Procedure Laterality Date  . WISDOM TOOTH EXTRACTION      Social History   Tobacco Use  . Smoking status: Never Smoker  . Smokeless tobacco: Never Used  Substance Use Topics  . Alcohol use: Yes    Alcohol/week: 0.0 standard drinks  . Drug use: No    Family History  Problem Relation Age of Onset  . Alcoholism Father   . Arthritis Father   . Hypertension Father   . Arthritis Paternal Grandfather   . Hypertension Paternal Grandfather   . Diabetes Paternal Grandfather   . Arthritis Paternal Grandmother   . Diabetes Maternal Grandfather   . Diabetes Maternal Grandmother     No Known Allergies  Medication list has been reviewed and updated.  Current Outpatient Medications on File Prior to Visit  Medication Sig Dispense Refill  . VIENVA 0.1-20 MG-MCG tablet TAKE 1 TABLET BY MOUTH DAILY 84 tablet 3   No current facility-administered medications on file prior to visit.     Review of Systems:  As per HPI- otherwise negative. No fever chills, no chest pain or shortness of breath  She does not do a lot of formal exercise, but is very active and gets a lot of steps with her various activities  Physical Examination: Vitals:   07/21/18 0921  BP: 112/82  Pulse: 95  Resp: 16  Temp: 99.1 F (37.3 C)  SpO2: 99%   Vitals:   07/21/18 0921  Weight: 231 lb (104.8 kg)  Height: 5\' 5"  (1.651 m)   Body mass index is 38.44 kg/m. Ideal Body Weight: Weight in (lb) to have BMI = 25: 149.9  GEN: WDWN, NAD, Non-toxic, A & O x 3, looks well, obese  HEENT: Atraumatic, Normocephalic. Neck supple. No masses, No LAD.  Bilateral TM wnl, oropharynx normal.  PEERL,EOMI.   Ears and Nose: No external deformity. CV: RRR, No M/G/R. No JVD. No thrill. No extra heart sounds. PULM: CTA B, no wheezes, crackles, rhonchi. No retractions. No resp. distress. No accessory muscle use. ABD: S, NT, ND, +BS. No rebound. No HSM. EXTR: No c/c/e NEURO Normal gait.  PSYCH:  Normally interactive. Conversant. Not depressed or anxious appearing.  Calm demeanor.  Breast: normal exam, no masses/ dimpling/ discharge Pelvic: normal, no vaginal lesions or discharge. Uterus normal, no CMT, no adnexal tendereness or masses   Assessment and Plan: Physical exam  Screening for cervical cancer - Plan: Cytology - PAP  Screening for diabetes mellitus - Plan: Comprehensive metabolic panel, Hemoglobin A1c  Screening for deficiency anemia - Plan: CBC  Screening for hyperlipidemia - Plan: Lipid panel  GAD (generalized anxiety disorder) - Plan: venlafaxine XR (EFFEXOR-XR) 75 MG 24 hr capsule, TSH  Here today for complete physical Lab and blood work pending today Flu shot is up-to-date.  She is not sure of the date of her last tetanus, but feels certain was within 10 years She also brings up anxiety today.  She would be interested in starting some medication, notes that her sister has done well with Effexor.  We will start her on 75 mg now, asked her to update me via my chart in 3 to 4 weeks.  Sooner if any problems or concerns.  Signed Abbe Amsterdam, MD Received her labs as follows, message to pt   We will order a CMP for 1 month from now to follow-up on her calcium and ALT  Results for orders placed or performed in visit on 07/21/18  CBC  Result Value Ref Range   WBC 9.4 4.0 - 10.5 K/uL   RBC 4.62 3.87 - 5.11 Mil/uL   Platelets 310.0 150.0 - 400.0 K/uL   Hemoglobin 14.7 12.0 - 15.0 g/dL   HCT 60.6 00.4 - 59.9 %   MCV 89.7 78.0 - 100.0 fl   MCHC 35.4 30.0 - 36.0 g/dL   RDW 77.4 14.2 - 39.5 %  Comprehensive metabolic panel  Result Value Ref Range   Sodium 140 135 - 145 mEq/L   Potassium 5.1 3.5 - 5.1 mEq/L   Chloride 99 96 - 112 mEq/L   CO2 30 19 - 32 mEq/L   Glucose, Bld 88 70 - 99 mg/dL   BUN 10 6 - 23 mg/dL   Creatinine, Ser 3.20 0.40 - 1.20 mg/dL   Total Bilirubin 0.7 0.2 - 1.2 mg/dL   Alkaline Phosphatase 56 39 - 117 U/L   AST 28 0 - 37 U/L   ALT 49  (H) 0 - 35 U/L   Total Protein 7.6 6.0 - 8.3 g/dL   Albumin 4.6 3.5 - 5.2 g/dL   Calcium 23.3 (H) 8.4 - 10.5 mg/dL   GFR 43.56 >86.16 mL/min  Hemoglobin A1c  Result Value Ref Range   Hgb A1c MFr Bld 4.9 4.6 - 6.5 %  Lipid panel  Result Value Ref Range   Cholesterol 258 (H) 0 - 200 mg/dL   Triglycerides 542.7 (H) 0.0 - 149.0 mg/dL   HDL 06.23 (L) >76.28 mg/dL   VLDL 31.5 (H) 0.0 - 17.6 mg/dL   Total CHOL/HDL Ratio 7    NonHDL 220.85   TSH  Result Value Ref Range   TSH 3.54 0.35 - 4.50 uIU/mL  LDL cholesterol, direct  Result Value Ref Range   Direct LDL 194.0 mg/dL

## 2018-07-21 ENCOUNTER — Encounter: Payer: Self-pay | Admitting: Family Medicine

## 2018-07-21 ENCOUNTER — Ambulatory Visit (INDEPENDENT_AMBULATORY_CARE_PROVIDER_SITE_OTHER): Payer: Managed Care, Other (non HMO) | Admitting: Family Medicine

## 2018-07-21 ENCOUNTER — Other Ambulatory Visit (HOSPITAL_COMMUNITY)
Admission: RE | Admit: 2018-07-21 | Discharge: 2018-07-21 | Disposition: A | Discharge status: Home / Self Care | Payer: Managed Care, Other (non HMO) | Source: Ambulatory Visit | Attending: Family Medicine | Admitting: Family Medicine

## 2018-07-21 VITALS — BP 112/82 | HR 95 | Temp 99.1°F | Resp 16 | Ht 65.0 in | Wt 231.0 lb

## 2018-07-21 DIAGNOSIS — Z13220 Encounter for screening for lipoid disorders: Secondary | ICD-10-CM

## 2018-07-21 DIAGNOSIS — Z124 Encounter for screening for malignant neoplasm of cervix: Secondary | ICD-10-CM

## 2018-07-21 DIAGNOSIS — F411 Generalized anxiety disorder: Secondary | ICD-10-CM | POA: Diagnosis not present

## 2018-07-21 DIAGNOSIS — Z131 Encounter for screening for diabetes mellitus: Secondary | ICD-10-CM

## 2018-07-21 DIAGNOSIS — Z130 Encounter for screening for diseases of the blood and blood-forming organs and certain disorders involving the immune mechanism: Secondary | ICD-10-CM

## 2018-07-21 DIAGNOSIS — Z0000 Encounter for general adult medical examination without abnormal findings: Secondary | ICD-10-CM

## 2018-07-21 LAB — COMPREHENSIVE METABOLIC PANEL
ALT: 49 U/L — ABNORMAL HIGH (ref 0–35)
AST: 28 U/L (ref 0–37)
Albumin: 4.6 g/dL (ref 3.5–5.2)
Alkaline Phosphatase: 56 U/L (ref 39–117)
BUN: 10 mg/dL (ref 6–23)
CALCIUM: 10.7 mg/dL — AB (ref 8.4–10.5)
CO2: 30 mEq/L (ref 19–32)
Chloride: 99 mEq/L (ref 96–112)
Creatinine, Ser: 1.02 mg/dL (ref 0.40–1.20)
GFR: 64.15 mL/min (ref >60.00)
Glucose, Bld: 88 mg/dL (ref 70–99)
Potassium: 5.1 mEq/L (ref 3.5–5.1)
Sodium: 140 mEq/L (ref 135–145)
Total Bilirubin: 0.7 mg/dL (ref 0.2–1.2)
Total Protein: 7.6 g/dL (ref 6.0–8.3)

## 2018-07-21 LAB — CBC
HCT: 41.4 % (ref 36.0–46.0)
Hemoglobin: 14.7 g/dL (ref 12.0–15.0)
MCHC: 35.4 g/dL (ref 30.0–36.0)
MCV: 89.7 fl (ref 78.0–100.0)
PLATELETS: 310 10*3/uL (ref 150.0–400.0)
RBC: 4.62 Mil/uL (ref 3.87–5.11)
RDW: 12.7 % (ref 11.5–15.5)
WBC: 9.4 10*3/uL (ref 4.0–10.5)

## 2018-07-21 LAB — LIPID PANEL
Cholesterol: 258 mg/dL — ABNORMAL HIGH (ref 0–200)
HDL: 37.6 mg/dL — AB (ref >39.00)
NonHDL: 220.85
Total CHOL/HDL Ratio: 7
Triglycerides: 329 mg/dL — ABNORMAL HIGH (ref 0.0–149.0)
VLDL: 65.8 mg/dL — ABNORMAL HIGH (ref 0.0–40.0)

## 2018-07-21 LAB — LDL CHOLESTEROL, DIRECT: Direct LDL: 194 mg/dL

## 2018-07-21 LAB — TSH: TSH: 3.54 u[IU]/mL (ref 0.35–4.50)

## 2018-07-21 LAB — HEMOGLOBIN A1C: Hgb A1c MFr Bld: 4.9 % (ref 4.6–6.5)

## 2018-07-21 MED ORDER — VENLAFAXINE HCL ER 75 MG PO CP24: 75.0000 mg | ORAL_CAPSULE | Freq: Every day | ORAL | 6 refills | Status: DC

## 2018-07-21 NOTE — Addendum Note (Signed)
Addended by: Pearline Cables on: 07/21/2018 08:52 PM   Modules accepted: Orders

## 2018-07-21 NOTE — Patient Instructions (Addendum)
It was great to see you today, you did so well! I will be in touch with your labs ASAP Continue to work on physical activity and healthy diet   I sent in a prescription for Effexor for you, 75 mg once a day.  I hope this will help with symptoms of anxiety.  Please let me know how it works for you, if you do not like it or think you need to go up on the dose we can certainly make an adjustment.  Please update me over my chart in 3 to 4 weeks regarding your anxiety.  I am also glad to see you in the office  Health Maintenance, Female Adopting a healthy lifestyle and getting preventive care can go a long way to promote health and wellness. Talk with your health care provider about what schedule of regular examinations is right for you. This is a good chance for you to check in with your provider about disease prevention and staying healthy. In between checkups, there are plenty of things you can do on your own. Experts have done a lot of research about which lifestyle changes and preventive measures are most likely to keep you healthy. Ask your health care provider for more information. Weight and diet Eat a healthy diet  Be sure to include plenty of vegetables, fruits, low-fat dairy products, and lean protein.  Do not eat a lot of foods high in solid fats, added sugars, or salt.  Get regular exercise. This is one of the most important things you can do for your health. ? Most adults should exercise for at least 150 minutes each week. The exercise should increase your heart rate and make you sweat (moderate-intensity exercise). ? Most adults should also do strengthening exercises at least twice a week. This is in addition to the moderate-intensity exercise. Maintain a healthy weight  Body mass index (BMI) is a measurement that can be used to identify possible weight problems. It estimates body fat based on height and weight. Your health care provider can help determine your BMI and help you  achieve or maintain a healthy weight.  For females 63 years of age and older: ? A BMI below 18.5 is considered underweight. ? A BMI of 18.5 to 24.9 is normal. ? A BMI of 25 to 29.9 is considered overweight. ? A BMI of 30 and above is considered obese. Watch levels of cholesterol and blood lipids  You should start having your blood tested for lipids and cholesterol at 29 years of age, then have this test every 5 years.  You may need to have your cholesterol levels checked more often if: ? Your lipid or cholesterol levels are high. ? You are older than 29 years of age. ? You are at high risk for heart disease. Cancer screening Lung Cancer  Lung cancer screening is recommended for adults 93-56 years old who are at high risk for lung cancer because of a history of smoking.  A yearly low-dose CT scan of the lungs is recommended for people who: ? Currently smoke. ? Have quit within the past 15 years. ? Have at least a 30-pack-year history of smoking. A pack year is smoking an average of one pack of cigarettes a day for 1 year.  Yearly screening should continue until it has been 15 years since you quit.  Yearly screening should stop if you develop a health problem that would prevent you from having lung cancer treatment. Breast Cancer  Practice breast self-awareness.  This means understanding how your breasts normally appear and feel.  It also means doing regular breast self-exams. Let your health care provider know about any changes, no matter how small.  If you are in your 20s or 30s, you should have a clinical breast exam (CBE) by a health care provider every 1-3 years as part of a regular health exam.  If you are 2 or older, have a CBE every year. Also consider having a breast X-ray (mammogram) every year.  If you have a family history of breast cancer, talk to your health care provider about genetic screening.  If you are at high risk for breast cancer, talk to your health care  provider about having an MRI and a mammogram every year.  Breast cancer gene (BRCA) assessment is recommended for women who have family members with BRCA-related cancers. BRCA-related cancers include: ? Breast. ? Ovarian. ? Tubal. ? Peritoneal cancers.  Results of the assessment will determine the need for genetic counseling and BRCA1 and BRCA2 testing. Cervical Cancer Your health care provider may recommend that you be screened regularly for cancer of the pelvic organs (ovaries, uterus, and vagina). This screening involves a pelvic examination, including checking for microscopic changes to the surface of your cervix (Pap test). You may be encouraged to have this screening done every 3 years, beginning at age 26.  For women ages 27-65, health care providers may recommend pelvic exams and Pap testing every 3 years, or they may recommend the Pap and pelvic exam, combined with testing for human papilloma virus (HPV), every 5 years. Some types of HPV increase your risk of cervical cancer. Testing for HPV may also be done on women of any age with unclear Pap test results.  Other health care providers may not recommend any screening for nonpregnant women who are considered low risk for pelvic cancer and who do not have symptoms. Ask your health care provider if a screening pelvic exam is right for you.  If you have had past treatment for cervical cancer or a condition that could lead to cancer, you need Pap tests and screening for cancer for at least 20 years after your treatment. If Pap tests have been discontinued, your risk factors (such as having a new sexual partner) need to be reassessed to determine if screening should resume. Some women have medical problems that increase the chance of getting cervical cancer. In these cases, your health care provider may recommend more frequent screening and Pap tests. Colorectal Cancer  This type of cancer can be detected and often prevented.  Routine  colorectal cancer screening usually begins at 29 years of age and continues through 29 years of age.  Your health care provider may recommend screening at an earlier age if you have risk factors for colon cancer.  Your health care provider may also recommend using home test kits to check for hidden blood in the stool.  A small camera at the end of a tube can be used to examine your colon directly (sigmoidoscopy or colonoscopy). This is done to check for the earliest forms of colorectal cancer.  Routine screening usually begins at age 60.  Direct examination of the colon should be repeated every 5-10 years through 29 years of age. However, you may need to be screened more often if early forms of precancerous polyps or small growths are found. Skin Cancer  Check your skin from head to toe regularly.  Tell your health care provider about any new moles or  changes in moles, especially if there is a change in a mole's shape or color.  Also tell your health care provider if you have a mole that is larger than the size of a pencil eraser.  Always use sunscreen. Apply sunscreen liberally and repeatedly throughout the day.  Protect yourself by wearing long sleeves, pants, a wide-brimmed hat, and sunglasses whenever you are outside. Heart disease, diabetes, and high blood pressure  High blood pressure causes heart disease and increases the risk of stroke. High blood pressure is more likely to develop in: ? People who have blood pressure in the high end of the normal range (130-139/85-89 mm Hg). ? People who are overweight or obese. ? People who are African American.  If you are 70-58 years of age, have your blood pressure checked every 3-5 years. If you are 41 years of age or older, have your blood pressure checked every year. You should have your blood pressure measured twice-once when you are at a hospital or clinic, and once when you are not at a hospital or clinic. Record the average of the two  measurements. To check your blood pressure when you are not at a hospital or clinic, you can use: ? An automated blood pressure machine at a pharmacy. ? A home blood pressure monitor.  If you are between 83 years and 82 years old, ask your health care provider if you should take aspirin to prevent strokes.  Have regular diabetes screenings. This involves taking a blood sample to check your fasting blood sugar level. ? If you are at a normal weight and have a low risk for diabetes, have this test once every three years after 29 years of age. ? If you are overweight and have a high risk for diabetes, consider being tested at a younger age or more often. Preventing infection Hepatitis B  If you have a higher risk for hepatitis B, you should be screened for this virus. You are considered at high risk for hepatitis B if: ? You were born in a country where hepatitis B is common. Ask your health care provider which countries are considered high risk. ? Your parents were born in a high-risk country, and you have not been immunized against hepatitis B (hepatitis B vaccine). ? You have HIV or AIDS. ? You use needles to inject street drugs. ? You live with someone who has hepatitis B. ? You have had sex with someone who has hepatitis B. ? You get hemodialysis treatment. ? You take certain medicines for conditions, including cancer, organ transplantation, and autoimmune conditions. Hepatitis C  Blood testing is recommended for: ? Everyone born from [birth year] through 1966. ? Anyone with known risk factors for hepatitis C. Sexually transmitted infections (STIs)  You should be screened for sexually transmitted infections (STIs) including gonorrhea and chlamydia if: ? You are sexually active and are younger than 29 years of age. ? You are older than 29 years of age and your health care provider tells you that you are at risk for this type of infection. ? Your sexual activity has changed since you were last  screened and you are at an increased risk for chlamydia or gonorrhea. Ask your health care provider if you are at risk.  If you do not have HIV, but are at risk, it may be recommended that you take a prescription medicine daily to prevent HIV infection. This is called pre-exposure prophylaxis (PrEP). You are considered at risk if: ? You are sexually  active and do not regularly use condoms or know the HIV status of your partner(s). ? You take drugs by injection. ? You are sexually active with a partner who has HIV. Talk with your health care provider about whether you are at high risk of being infected with HIV. If you choose to begin PrEP, you should first be tested for HIV. You should then be tested every 3 months for as long as you are taking PrEP. Pregnancy  If you are premenopausal and you may become pregnant, ask your health care provider about preconception counseling.  If you may become pregnant, take 400 to 800 micrograms (mcg) of folic acid every day.  If you want to prevent pregnancy, talk to your health care provider about birth control (contraception). Osteoporosis and menopause  Osteoporosis is a disease in which the bones lose minerals and strength with aging. This can result in serious bone fractures. Your risk for osteoporosis can be identified using a bone density scan.  If you are 51 years of age or older, or if you are at risk for osteoporosis and fractures, ask your health care provider if you should be screened.  Ask your health care provider whether you should take a calcium or vitamin D supplement to lower your risk for osteoporosis.  Menopause may have certain physical symptoms and risks.  Hormone replacement therapy may reduce some of these symptoms and risks. Talk to your health care provider about whether hormone replacement therapy is right for you. Follow these instructions at home:  Schedule regular health, dental, and eye exams.  Stay current with your  immunizations.  Do not use any tobacco products including cigarettes, chewing tobacco, or electronic cigarettes.  If you are pregnant, do not drink alcohol.  If you are breastfeeding, limit how much and how often you drink alcohol.  Limit alcohol intake to no more than 1 drink per day for nonpregnant women. One drink equals 12 ounces of beer, 5 ounces of wine, or 1 ounces of hard liquor.  Do not use street drugs.  Do not share needles.  Ask your health care provider for help if you need support or information about quitting drugs.  Tell your health care provider if you often feel depressed.  Tell your health care provider if you have ever been abused or do not feel safe at home. This information is not intended to replace advice given to you by your health care provider. Make sure you discuss any questions you have with your health care provider. Document Released: 11/24/2010 Document Revised: 10/17/2015 Document Reviewed: 02/12/2015 Elsevier Interactive Patient Education  2019 [employer].

## 2018-07-22 LAB — CYTOLOGY - PAP: Diagnosis: NEGATIVE

## 2018-10-07 ENCOUNTER — Encounter: Payer: Self-pay | Admitting: Family Medicine

## 2018-10-20 ENCOUNTER — Encounter: Payer: Self-pay | Admitting: Family Medicine

## 2018-10-24 ENCOUNTER — Other Ambulatory Visit: Payer: Self-pay

## 2018-10-24 ENCOUNTER — Encounter: Payer: Self-pay | Admitting: Family Medicine

## 2018-10-24 ENCOUNTER — Ambulatory Visit: Payer: Managed Care, Other (non HMO) | Admitting: Family Medicine

## 2018-10-24 VITALS — BP 128/80 | HR 74 | Temp 98.5°F | Resp 16 | Ht 65.0 in | Wt 234.0 lb

## 2018-10-24 DIAGNOSIS — Z23 Encounter for immunization: Secondary | ICD-10-CM | POA: Diagnosis not present

## 2018-10-24 DIAGNOSIS — Z0184 Encounter for antibody response examination: Secondary | ICD-10-CM

## 2018-10-24 DIAGNOSIS — Z020 Encounter for examination for admission to educational institution: Secondary | ICD-10-CM

## 2018-10-24 DIAGNOSIS — Z283 Underimmunization status: Secondary | ICD-10-CM | POA: Diagnosis not present

## 2018-10-24 NOTE — Patient Instructions (Signed)
We gave you a Tdap vaccine today for tetanus You will then need a dose of "Td" in 4 weeks, and a final dose after 6-12 months  This will complete all of your 3 required doses of tetanus vaccine  We also drew an MMR titer for you today- I will be in touch with this result.  Assuming you are immune to measles, mumps and rubella you are all set here  Gardasil vaccination is also a great idea to help prevent complications of HPV The 3 doses are given at 0, 1-2 months and then 6 months  We are glad to give you this vaccine series if you like!

## 2018-10-24 NOTE — Progress Notes (Addendum)
[facility] [address], [place], [place] [address] [phone]  Date:  10/24/2018   Name:  Joanna Pena   DOB:  [birth year]   MRN:  [id number]  PCP:  Darreld Mclean, MD    Chief Complaint: Immunizations   History of Present Illness:  Joanna Pena is a 28 y.o. very pleasant female patient who presents with the following:  In person visit today Generally healthy young woman who had contacted me about immunization update.  She is planning to go back to school at [employer], and thinks she is due for a tetanus shot and possibly other immunizations She plans to study sociology but is not sure if classes will be in person or not this fall  She does not have her old childhood immunizations- we have tried and failed to find these for he She feels like she got all of her necessary shots per normal schedule  She thinks her last tetanus booster was about 8 years ago, but is not quite sure She is also initiating getting Gardasil  I have reviewed her forms from [employer]. She will need 3 tetanus vaccines, and MMR titer Nothing else is required for class    Patient Active Problem List   Diagnosis Date Noted  . Avulsion fracture of ankle 05/13/2017  . Patient overweight 11/28/2015  . Tinnitus 11/28/2015    Past Medical History:  Diagnosis Date  . Blood in stool   . Frequent headaches   . Migraines   . Urinary tract infection     Past Surgical History:  Procedure Laterality Date  . WISDOM TOOTH EXTRACTION      Social History   Tobacco Use  . Smoking status: Never Smoker  . Smokeless tobacco: Never Used  Substance Use Topics  . Alcohol use: Yes    Alcohol/week: 0.0 standard drinks  . Drug use: No    Family History  Problem Relation Age of Onset  . Alcoholism Father   . Arthritis Father   . Hypertension Father   . Arthritis Paternal Grandfather   . Hypertension Paternal Grandfather   . Diabetes Paternal  Grandfather   . Arthritis Paternal Grandmother   . Diabetes Maternal Grandfather   . Diabetes Maternal Grandmother     No Known Allergies  Medication list has been reviewed and updated.  Current Outpatient Medications on File Prior to Visit  Medication Sig Dispense Refill  . venlafaxine XR (EFFEXOR-XR) 75 MG 24 hr capsule Take 1 capsule (75 mg total) by mouth daily with breakfast. 30 capsule 6  . VIENVA 0.1-20 MG-MCG tablet TAKE 1 TABLET BY MOUTH DAILY 84 tablet 3   No current facility-administered medications on file prior to visit.     Review of Systems:  As per HPI- otherwise negative. No fever or chills, feeling well today She states no chance of current pregnancy Physical Examination: Vitals:   10/24/18 1110  BP: 128/80  Pulse: 74  Resp: 16  Temp: 98.5 F (36.9 C)  SpO2: 98%   Vitals:   10/24/18 1110  Weight: 234 lb (106.1 kg)  Height: 5' 5"  (1.651 m)   Body mass index is 38.94 kg/m. Ideal Body Weight: Weight in (lb) to have BMI = 25: 149.9  GEN: WDWN, NAD, Non-toxic, A & O x 3, obese, looks well HEENT: Atraumatic, Normocephalic. Neck supple. No masses, No LAD. Ears and Nose: No external deformity. CV: RRR, No M/G/R. No JVD. No thrill. No  extra heart sounds. PULM: CTA B, no wheezes, crackles, rhonchi. No retractions. No resp. distress. No accessory muscle use. EXTR: No c/c/e NEURO Normal gait.  PSYCH: Normally interactive. Conversant. Not depressed or anxious appearing.  Calm demeanor.    Assessment and Plan: Immunization due - Plan: Measles/Mumps/Rubella Immunity, Tdap vaccine greater than or equal to 7yo IM  Catching up on immunizations for college.  Given Tdap today, MMR titers drawn. She will return for a Td in 1 month, and then again in 6 to 12 months. She is interested in having Gardasil, but wishes to check on coverage with her insurance company first.  [place], MD Received her labs 6/2- immune to MMR Message to pt   Results  for orders placed or performed in visit on 10/24/18  Measles/Mumps/Rubella Immunity  Result Value Ref Range   Rubeola IgG 182.00 AU/mL   Mumps IgG 117.00 AU/mL   Rubella 1.14 index

## 2018-10-25 LAB — MEASLES/MUMPS/RUBELLA IMMUNITY
Mumps IgG: 117 AU/mL
Rubella: 1.14 index
Rubeola IgG: 182 AU/mL

## 2018-12-27 ENCOUNTER — Encounter: Payer: Self-pay | Admitting: Family Medicine

## 2018-12-27 DIAGNOSIS — J069 Acute upper respiratory infection, unspecified: Secondary | ICD-10-CM

## 2018-12-28 ENCOUNTER — Other Ambulatory Visit: Payer: Self-pay

## 2018-12-28 DIAGNOSIS — Z20822 Contact with and (suspected) exposure to covid-19: Secondary | ICD-10-CM

## 2018-12-29 LAB — NOVEL CORONAVIRUS, NAA: SARS-CoV-2, NAA: NOT DETECTED

## 2019-01-25 ENCOUNTER — Other Ambulatory Visit: Payer: Self-pay | Admitting: Family Medicine

## 2019-01-25 DIAGNOSIS — F411 Generalized anxiety disorder: Secondary | ICD-10-CM

## 2019-05-18 ENCOUNTER — Other Ambulatory Visit: Payer: Self-pay | Admitting: Family Medicine

## 2019-05-18 DIAGNOSIS — Z30011 Encounter for initial prescription of contraceptive pills: Secondary | ICD-10-CM

## 2019-05-18 MED ORDER — LEVONORGESTREL-ETHINYL ESTRAD 0.1-20 MG-MCG PO TABS: 1.0000 | ORAL_TABLET | Freq: Every day | ORAL | 3 refills | Status: DC

## 2019-05-18 NOTE — Telephone Encounter (Signed)
Copied from [place] [id number]. Topic: Quick Communication - Rx Refill/Question >> May 18, 2019 10:09 AM Leward Quan A wrote: Medication: VIENVA 0.1-20 MG-MCG tablet   Per patient almost out   Has the patient contacted their pharmacy? Yes.   (Agent: If no, request that the patient contact the pharmacy for the refill.) (Agent: If yes, when and what did the pharmacy advise?)  Preferred Pharmacy (with phone number or street name): [id number] - [place], [place] AT [place]  Phone:  [phone] Fax:  [phone]     Agent: Please be advised that RX refills may take up to 3 business days. We ask that you follow-up with your pharmacy.

## 2019-07-31 ENCOUNTER — Ambulatory Visit: Payer: Managed Care, Other (non HMO) | Attending: Internal Medicine

## 2019-07-31 DIAGNOSIS — Z23 Encounter for immunization: Secondary | ICD-10-CM | POA: Insufficient documentation

## 2019-07-31 NOTE — Progress Notes (Signed)
   Covid-19 Vaccination Clinic  Name:  Joanna Pena    MRN: [id number] DOB: 27-Nov-1990  07/31/2019  [name] was observed post Covid-19 immunization for 15 minutes without incident. She was provided with Vaccine Information Sheet and instruction to access the V-Safe system.   [name] was instructed to call 911 with any severe reactions post vaccine: Marland Kitchen Difficulty breathing  . Swelling of face and throat  . A fast heartbeat  . A bad rash all over body  . Dizziness and weakness   Immunizations Administered    Name Date Dose VIS Date Route   Pfizer COVID-19 Vaccine 07/31/2019  2:51 PM 0.3 mL 05/05/2019 Intramuscular   Manufacturer: [employer], [employer]   Lot: [id number]   NDC: [address]-9

## 2019-08-18 ENCOUNTER — Telehealth: Payer: Self-pay | Admitting: Family Medicine

## 2019-08-18 DIAGNOSIS — F411 Generalized anxiety disorder: Secondary | ICD-10-CM

## 2019-08-18 MED ORDER — VENLAFAXINE HCL ER 75 MG PO CP24: ORAL_CAPSULE | ORAL | 2 refills | Status: DC

## 2019-08-18 NOTE — Telephone Encounter (Signed)
Medication: venlafaxine XR (EFFEXOR-XR) 75 MG 24 hr capsule   Has the patient contacted their pharmacy? Yes.   (If no, request that the patient contact the pharmacy for the refill.) (If yes, when and what did the pharmacy advise?)  Preferred Pharmacy (with phone number or street name):   [id number] - [place], [place] - [address]  [address], [place] [address]  Phone:  [phone] Fax:  [phone]  Agent: Please be advised that RX refills may take up to 3 business days. We ask that you follow-up with your pharmacy.  

## 2019-08-18 NOTE — Telephone Encounter (Signed)
Medication sent to pharmacy  

## 2019-08-30 ENCOUNTER — Ambulatory Visit: Payer: Managed Care, Other (non HMO)

## 2019-09-11 ENCOUNTER — Ambulatory Visit: Payer: Managed Care, Other (non HMO) | Attending: Internal Medicine

## 2019-09-11 DIAGNOSIS — Z23 Encounter for immunization: Secondary | ICD-10-CM

## 2019-09-11 NOTE — Progress Notes (Signed)
   Covid-19 Vaccination Clinic  Name:  [name]    MRN: [id number] DOB: 1990-11-16  09/11/2019  Ms. [name] was observed post Covid-19 immunization for 15 minutes without incident. She was provided with Vaccine Information Sheet and instruction to access the V-Safe system.   Ms. [name] was instructed to call 911 with any severe reactions post vaccine: Marland Kitchen Difficulty breathing  . Swelling of face and throat  . A fast heartbeat  . A bad rash all over body  . Dizziness and weakness   Immunizations Administered    Name Date Dose VIS Date Route   Pfizer COVID-19 Vaccine 09/11/2019  8:56 AM 0.3 mL 07/19/2018 Intramuscular   Manufacturer: [employer], [employer]   Lot: [id number]   NDC: [address]-5

## 2019-11-28 ENCOUNTER — Telehealth: Payer: Self-pay | Admitting: Family Medicine

## 2019-11-28 DIAGNOSIS — F411 Generalized anxiety disorder: Secondary | ICD-10-CM

## 2019-11-28 MED ORDER — VENLAFAXINE HCL ER 75 MG PO CP24: ORAL_CAPSULE | ORAL | 0 refills | Status: DC

## 2019-11-28 NOTE — Telephone Encounter (Signed)
Medication: venlafaxine XR (EFFEXOR-XR) 75 MG 24 hr capsule   Has the patient contacted their pharmacy? Yes.   (If no, request that the patient contact the pharmacy for the refill.) (If yes, when and what did the pharmacy advise?)  Preferred Pharmacy (with phone number or street name):   [facility] #[id number] - [place], [place] - [address] AT [facility] [address]  [address], [place] [address]  Phone:  [phone] Fax:  [phone]  Agent: Please be advised that RX refills may take up to 3 business days. We ask that you follow-up with your pharmacy.

## 2019-11-28 NOTE — Telephone Encounter (Signed)
Sent two week supply. Patient needs appointment prior to further refills.

## 2019-12-11 ENCOUNTER — Other Ambulatory Visit: Payer: Self-pay | Admitting: Family Medicine

## 2019-12-11 DIAGNOSIS — F411 Generalized anxiety disorder: Secondary | ICD-10-CM

## 2019-12-12 ENCOUNTER — Telehealth: Payer: Self-pay | Admitting: Family Medicine

## 2019-12-12 DIAGNOSIS — F411 Generalized anxiety disorder: Secondary | ICD-10-CM

## 2019-12-12 NOTE — Telephone Encounter (Signed)
venlafaxine XR (EFFEXOR-XR) 75 MG 24 hr capsule   Patient states only 4 pills left and she needs at least two more pills to make it until her appointment on July 26,2021.  Please Advise

## 2019-12-13 MED ORDER — VENLAFAXINE HCL ER 75 MG PO CP24: ORAL_CAPSULE | ORAL | 0 refills | Status: DC

## 2019-12-13 NOTE — Telephone Encounter (Signed)
Refilled until she can come in for appt on 12/18/2019

## 2019-12-17 NOTE — Progress Notes (Addendum)
[facility] [address], [address] [place], [place] [address] [phone]  Date:  12/18/2019   Name:  Joanna Pena   DOB:  Mar 10, 1991   MRN:  [id number]  PCP:  Pearline Cables, MD    Chief Complaint: Gastroesophageal Reflux (burning of throat, upper abdominal pain)   History of Present Illness:  Joanna Pena is a 29 y.o. very pleasant female patient who presents with the following:  Generally healthy woman here today with concern of swallowing problems and stomach discomfort Last seen by myself about a year ago for immunization update   She has noted sx of reflux - nausea, even bland foods may trigger taste/ feel of acid in her throat She does have dx of hiatal hernia -from several years ago. She wonders if this could be contributing No vomiting Some diarrhea on occasion Greasy or fatty foods, chocolate make her worse She does ok when she lays down flat -symptoms are actually not worse at night She states no chance of pregnancy- her LMP was 3 weeks ago and she is on birth control pills She takes tums prn- this helps temporarily   She does have some anxiety sx as well- feels like a lot of her anxiety will manifest in her stomach area She has been on effexor for about 18 months now. She felt like it worked pretty well, but has not gotten her quite where she needs to be She is currently taking 75 mg XR, after discussion she would like to try increasing to 150. If is not effective we can try different medication  We did do an abdominal ultrasound for her in 2019, no gallstones at that time   Patient Active Problem List   Diagnosis Date Noted  . Avulsion fracture of ankle 05/13/2017  . Patient overweight 11/28/2015  . Tinnitus 11/28/2015    Past Medical History:  Diagnosis Date  . Blood in stool   . Frequent headaches   . Migraines   . Urinary tract infection     Past Surgical History:  Procedure Laterality Date   . WISDOM TOOTH EXTRACTION      Social History   Tobacco Use  . Smoking status: Never Smoker  . Smokeless tobacco: Never Used  Substance Use Topics  . Alcohol use: Yes    Alcohol/week: 0.0 standard drinks  . Drug use: No    Family History  Problem Relation Age of Onset  . Alcoholism Father   . Arthritis Father   . Hypertension Father   . Arthritis Paternal Grandfather   . Hypertension Paternal Grandfather   . Diabetes Paternal Grandfather   . Arthritis Paternal Grandmother   . Diabetes Maternal Grandfather   . Diabetes Maternal Grandmother     No Known Allergies  Medication list has been reviewed and updated.  Current Outpatient Medications on File Prior to Visit  Medication Sig Dispense Refill  . levonorgestrel-ethinyl estradiol (VIENVA) 0.1-20 MG-MCG tablet Take 1 tablet by mouth daily. 84 tablet 3  . venlafaxine XR (EFFEXOR-XR) 75 MG 24 hr capsule TAKE 1 CAPSULE(75 MG) BY MOUTH DAILY WITH BREAKFAST 15 capsule 0   No current facility-administered medications on file prior to visit.    Review of Systems:  As per HPI- otherwise negative.   Physical Examination: Vitals:   12/18/19 1453  BP: 128/82  Pulse: 102  Resp: 17  SpO2: 99%   Vitals:   12/18/19 1453  Weight: (!) 264 lb (119.7  kg)  Height: 5\' 5"  (1.651 m)   Body mass index is 43.93 kg/m. Ideal Body Weight: Weight in (lb) to have BMI = 25: 149.9  GEN: no acute distress. Obese, otherwise looks well HEENT: Atraumatic, Normocephalic.   Bilateral TM wnl, oropharynx normal.  PEERL,EOMI.   Ears and Nose: No external deformity. CV: RRR, No M/G/R. No JVD. No thrill. No extra heart sounds. PULM: CTA B, no wheezes, crackles, rhonchi. No retractions. No resp. distress. No accessory muscle use. ABD: S, ND, +BS. No rebound. No HSM. Minimal epigastric tenderness, right upper quadrant is negative and negative Murphy sign EXTR: No c/c/e PSYCH: Normally interactive. Conversant.    Assessment and  Plan: Gastroesophageal reflux disease without esophagitis - Plan: H. pylori breath test, pantoprazole (PROTONIX) 40 MG tablet, sucralfate (CARAFATE) 1 g tablet  Screening for diabetes mellitus - Plan: Comprehensive metabolic panel, Hemoglobin A1c  Screening for deficiency anemia - Plan: CBC  Screening for hyperlipidemia - Plan: Lipid panel  Screening for thyroid disorder - Plan: TSH  GAD (generalized anxiety disorder) - Plan: venlafaxine XR (EFFEXOR-XR) 150 MG 24 hr capsule  Here today to discuss concerns of reflux. Patient had reflux for some time but it seems to getting worse. She is currently taking Tums. We will check an H. pylori breath test, treat with Protonix and Carafate. We will follow up with her pending her labs and check on her progress Other routine labs pending as above We will try increasing her Effexor to 150, if this is not effective in 4 to 6 weeks we will try different medication This visit occurred during the SARS-CoV-2 public health emergency.  Safety protocols were in place, including screening questions prior to the visit, additional usage of staff PPE, and extensive cleaning of exam room while observing appropriate contact time as indicated for disinfecting solutions.    Signed , MD  addnd 7/27- received her labs as below. Message to pt  Results for orders placed or performed in visit on 12/18/19  H. pylori breath test  Result Value Ref Range   H. pylori Breath Test NOT DETECTED NOT DETECT  CBC  Result Value Ref Range   WBC 12.5 (H) 4.0 - 10.5 K/uL   RBC 4.48 3.87 - 5.11 Mil/uL   Platelets 287.0 150 - 400 K/uL   Hemoglobin 13.9 12.0 - 15.0 g/dL   HCT 12/20/19 36 - 46 %   MCV 91.6 78.0 - 100.0 fl   MCHC 33.9 30.0 - 36.0 g/dL   RDW 54.5 62.5 - 63.8 %  Comprehensive metabolic panel  Result Value Ref Range   Sodium 136 135 - 145 mEq/L   Potassium 4.6 3.5 - 5.1 mEq/L   Chloride 100 96 - 112 mEq/L   CO2 27 19 - 32 mEq/L   Glucose, Bld 85 70 -  99 mg/dL   BUN 8 6 - 23 mg/dL   Creatinine, Ser 93.7 0.40 - 1.20 mg/dL   Total Bilirubin 0.5 0.2 - 1.2 mg/dL   Alkaline Phosphatase 57 39 - 117 U/L   AST 42 (H) 0 - 37 U/L   ALT 54 (H) 0 - 35 U/L   Total Protein 7.4 6.0 - 8.3 g/dL   Albumin 4.5 3.5 - 5.2 g/dL   GFR 3.42 87.68 mL/min   Calcium 10.0 8.4 - 10.5 mg/dL  Hemoglobin >11.57  Result Value Ref Range   Hgb A1c MFr Bld 5.2 4.6 - 6.5 %  Lipid panel  Result Value Ref Range   Cholesterol  330 (H) 0 - 200 mg/dL   Triglycerides 778.2 (H) 0 - 149 mg/dL   HDL 42.35 (L) >36.14 mg/dL   VLDL 43.1 (H) 0.0 - 54.0 mg/dL   Total CHOL/HDL Ratio 9    NonHDL 295.03   TSH  Result Value Ref Range   TSH 3.99 0.35 - 4.50 uIU/mL  LDL cholesterol, direct  Result Value Ref Range   Direct LDL 235.0 mg/dL   Your H pylori test is negative Blood counts are normal except for mildly elevated white cell count. This is probably unrelated to your symptoms but we will follow-up Your metabolic profile shows elevated liver counts- AST and ALT. Your cholesterol is also quite high.  I suspect you have fatty liver - let me order a liver ultrasound for you to make sure this is all we are looking at.  Wil set up for you Thyroid and A1c  (average blood sugar) are normal  I hope that your stomach is feeling better with the medication- let me know if not.  I will set up the liver ultrasound for you.   I would recommend that you work on gradual weight loss through diet and exercise changes; this should help bring your cholesterol down and help reduce fat in the liver  In any case, let's plan to repeat labs in 6 moths

## 2019-12-17 NOTE — Patient Instructions (Addendum)
Good to see you again today! Please try increasing your effexor to 150 daily- if not helpful in 4-6 weeks let me know and we can try a different medication Routine labs pending We will do an H pylori breath test today- this bacteria can cause chronic reflux protonix daily for 1-3 months carafate with meals and at bedtime for 10 days  Let me know if sx change or get worse!

## 2019-12-18 ENCOUNTER — Encounter: Payer: Self-pay | Admitting: Family Medicine

## 2019-12-18 ENCOUNTER — Other Ambulatory Visit: Payer: Self-pay

## 2019-12-18 ENCOUNTER — Ambulatory Visit: Payer: Managed Care, Other (non HMO) | Admitting: Family Medicine

## 2019-12-18 VITALS — BP 128/82 | HR 102 | Resp 17 | Ht 65.0 in | Wt 264.0 lb

## 2019-12-18 DIAGNOSIS — Z131 Encounter for screening for diabetes mellitus: Secondary | ICD-10-CM

## 2019-12-18 DIAGNOSIS — Z13220 Encounter for screening for lipoid disorders: Secondary | ICD-10-CM

## 2019-12-18 DIAGNOSIS — Z1329 Encounter for screening for other suspected endocrine disorder: Secondary | ICD-10-CM

## 2019-12-18 DIAGNOSIS — Z130 Encounter for screening for diseases of the blood and blood-forming organs and certain disorders involving the immune mechanism: Secondary | ICD-10-CM

## 2019-12-18 DIAGNOSIS — R7401 Elevation of levels of liver transaminase levels: Secondary | ICD-10-CM

## 2019-12-18 DIAGNOSIS — F411 Generalized anxiety disorder: Secondary | ICD-10-CM

## 2019-12-18 DIAGNOSIS — K219 Gastro-esophageal reflux disease without esophagitis: Secondary | ICD-10-CM | POA: Diagnosis not present

## 2019-12-18 MED ORDER — PANTOPRAZOLE SODIUM 40 MG PO TBEC: 40.0000 mg | DELAYED_RELEASE_TABLET | Freq: Every day | ORAL | 3 refills | Status: DC

## 2019-12-18 MED ORDER — SUCRALFATE 1 G PO TABS: 1.0000 g | ORAL_TABLET | Freq: Three times a day (TID) | ORAL | 0 refills | Status: DC

## 2019-12-18 MED ORDER — VENLAFAXINE HCL ER 150 MG PO CP24: ORAL_CAPSULE | ORAL | 6 refills | Status: DC

## 2019-12-19 LAB — CBC
HCT: 41 % (ref 36.0–46.0)
Hemoglobin: 13.9 g/dL (ref 12.0–15.0)
MCHC: 33.9 g/dL (ref 30.0–36.0)
MCV: 91.6 fl (ref 78.0–100.0)
Platelets: 287 10*3/uL (ref 150.0–400.0)
RBC: 4.48 Mil/uL (ref 3.87–5.11)
RDW: 13.1 % (ref 11.5–15.5)
WBC: 12.5 10*3/uL — ABNORMAL HIGH (ref 4.0–10.5)

## 2019-12-19 LAB — TSH: TSH: 3.99 u[IU]/mL (ref 0.35–4.50)

## 2019-12-19 LAB — LIPID PANEL
Cholesterol: 330 mg/dL — ABNORMAL HIGH (ref 0–200)
HDL: 34.8 mg/dL — ABNORMAL LOW (ref >39.00)
NonHDL: 295.03
Total CHOL/HDL Ratio: 9
Triglycerides: 392 mg/dL — ABNORMAL HIGH (ref 0.0–149.0)
VLDL: 78.4 mg/dL — ABNORMAL HIGH (ref 0.0–40.0)

## 2019-12-19 LAB — COMPREHENSIVE METABOLIC PANEL
ALT: 54 U/L — ABNORMAL HIGH (ref 0–35)
AST: 42 U/L — ABNORMAL HIGH (ref 0–37)
Albumin: 4.5 g/dL (ref 3.5–5.2)
Alkaline Phosphatase: 57 U/L (ref 39–117)
BUN: 8 mg/dL (ref 6–23)
CO2: 27 mEq/L (ref 19–32)
Calcium: 10 mg/dL (ref 8.4–10.5)
Chloride: 100 mEq/L (ref 96–112)
Creatinine, Ser: 0.95 mg/dL (ref 0.40–1.20)
GFR: 68.96 mL/min (ref >60.00)
Glucose, Bld: 85 mg/dL (ref 70–99)
Potassium: 4.6 mEq/L (ref 3.5–5.1)
Sodium: 136 mEq/L (ref 135–145)
Total Bilirubin: 0.5 mg/dL (ref 0.2–1.2)
Total Protein: 7.4 g/dL (ref 6.0–8.3)

## 2019-12-19 LAB — LDL CHOLESTEROL, DIRECT: Direct LDL: 235 mg/dL

## 2019-12-19 LAB — HEMOGLOBIN A1C: Hgb A1c MFr Bld: 5.2 % (ref 4.6–6.5)

## 2019-12-19 LAB — H. PYLORI BREATH TEST: H. pylori Breath Test: NOT DETECTED

## 2019-12-19 NOTE — Addendum Note (Signed)
Addended by: Abbe Amsterdam C on: 12/19/2019 04:41 PM   Modules accepted: Orders

## 2019-12-19 NOTE — Addendum Note (Signed)
Addended by: Abbe Amsterdam C on: 12/19/2019 04:33 PM   Modules accepted: Orders

## 2019-12-25 ENCOUNTER — Other Ambulatory Visit: Payer: Self-pay | Admitting: Family Medicine

## 2019-12-25 DIAGNOSIS — K219 Gastro-esophageal reflux disease without esophagitis: Secondary | ICD-10-CM

## 2019-12-27 ENCOUNTER — Ambulatory Visit
Admission: RE | Admit: 2019-12-27 | Discharge: 2019-12-27 | Disposition: A | Discharge status: Home / Self Care | Payer: Managed Care, Other (non HMO) | Source: Ambulatory Visit | Attending: Family Medicine | Admitting: Family Medicine

## 2019-12-27 DIAGNOSIS — R7401 Elevation of levels of liver transaminase levels: Secondary | ICD-10-CM

## 2019-12-28 ENCOUNTER — Encounter: Payer: Self-pay | Admitting: Family Medicine

## 2020-01-23 ENCOUNTER — Other Ambulatory Visit: Payer: Self-pay | Admitting: Family Medicine

## 2020-01-23 DIAGNOSIS — K219 Gastro-esophageal reflux disease without esophagitis: Secondary | ICD-10-CM

## 2020-03-21 ENCOUNTER — Other Ambulatory Visit: Payer: Self-pay | Admitting: Family Medicine

## 2020-03-21 DIAGNOSIS — K219 Gastro-esophageal reflux disease without esophagitis: Secondary | ICD-10-CM

## 2020-07-16 ENCOUNTER — Other Ambulatory Visit: Payer: Self-pay | Admitting: Family Medicine

## 2020-07-16 DIAGNOSIS — F411 Generalized anxiety disorder: Secondary | ICD-10-CM

## 2020-07-17 ENCOUNTER — Encounter: Payer: Self-pay | Admitting: Family Medicine

## 2020-07-17 DIAGNOSIS — F411 Generalized anxiety disorder: Secondary | ICD-10-CM

## 2020-07-17 MED ORDER — VENLAFAXINE HCL ER 150 MG PO CP24: 150.0000 mg | ORAL_CAPSULE | Freq: Every day | ORAL | 0 refills | Status: DC

## 2020-08-13 ENCOUNTER — Other Ambulatory Visit: Payer: Self-pay | Admitting: Family Medicine

## 2020-08-13 DIAGNOSIS — K219 Gastro-esophageal reflux disease without esophagitis: Secondary | ICD-10-CM

## 2020-11-12 ENCOUNTER — Encounter: Payer: Self-pay | Admitting: Family Medicine

## 2020-11-12 ENCOUNTER — Other Ambulatory Visit: Payer: Self-pay | Admitting: Family Medicine

## 2020-11-12 DIAGNOSIS — K219 Gastro-esophageal reflux disease without esophagitis: Secondary | ICD-10-CM

## 2020-11-12 DIAGNOSIS — F411 Generalized anxiety disorder: Secondary | ICD-10-CM

## 2020-11-12 MED ORDER — VENLAFAXINE HCL ER 150 MG PO CP24: ORAL_CAPSULE | ORAL | 1 refills | Status: DC

## 2020-11-12 MED ORDER — PANTOPRAZOLE SODIUM 40 MG PO TBEC
DELAYED_RELEASE_TABLET | ORAL | 1 refills | Status: DC
Start: 2020-11-12 — End: 2021-01-10

## 2020-12-11 ENCOUNTER — Other Ambulatory Visit: Payer: Self-pay | Admitting: Family Medicine

## 2020-12-11 DIAGNOSIS — F411 Generalized anxiety disorder: Secondary | ICD-10-CM

## 2021-01-10 ENCOUNTER — Other Ambulatory Visit: Payer: Self-pay | Admitting: Family Medicine

## 2021-01-10 DIAGNOSIS — F411 Generalized anxiety disorder: Secondary | ICD-10-CM

## 2021-01-10 DIAGNOSIS — K219 Gastro-esophageal reflux disease without esophagitis: Secondary | ICD-10-CM

## 2021-01-26 ENCOUNTER — Other Ambulatory Visit: Payer: Self-pay | Admitting: Family Medicine

## 2021-01-26 DIAGNOSIS — F411 Generalized anxiety disorder: Secondary | ICD-10-CM

## 2021-01-28 ENCOUNTER — Encounter: Payer: Self-pay | Admitting: Family Medicine

## 2021-01-28 DIAGNOSIS — F411 Generalized anxiety disorder: Secondary | ICD-10-CM

## 2021-01-28 MED ORDER — VENLAFAXINE HCL ER 150 MG PO CP24: 150.0000 mg | ORAL_CAPSULE | Freq: Every day | ORAL | 0 refills | Status: DC

## 2021-02-25 ENCOUNTER — Other Ambulatory Visit: Payer: Self-pay | Admitting: Family Medicine

## 2021-02-25 DIAGNOSIS — F411 Generalized anxiety disorder: Secondary | ICD-10-CM

## 2021-02-27 ENCOUNTER — Encounter: Payer: Self-pay | Admitting: Family Medicine

## 2021-02-27 DIAGNOSIS — F411 Generalized anxiety disorder: Secondary | ICD-10-CM

## 2021-02-27 MED ORDER — VENLAFAXINE HCL ER 150 MG PO CP24: 150.0000 mg | ORAL_CAPSULE | Freq: Every day | ORAL | 0 refills | Status: DC

## 2021-03-02 NOTE — Progress Notes (Addendum)
[facility] at [facility] [address], [address] [place], [place] [address] [phone]  Date:  03/05/2021   Name:  Joanna Pena   DOB:  09/07/1990   MRN:  [id number]  PCP:  Pearline Cables, MD    Chief Complaint: Annual Exam (Concerns/ questions: Anxiety meds and depression need to be addressed./Flu shot today: 2 weeks ago- [place]/HIV/ Hep C screen due)   History of Present Illness:  Joanna Pena is a 30 y.o. very pleasant female patient who presents with the following:  Pt seen today for a CPE  Last visit with myself in July 2021- at that time her LFTs were a bit elevated.  US showed most likely fatty liver.  Needs hep panel  Generally in good health  Can offer HIV/ hep C screening Covid booster-recommended Flu vaccine done   Most recent labs 7/21 Pap done early 2020- can update if pt likes   She started in effexor in 2020 She has noted more difficultly with concentration since she went on this medication She is not sure if this is due to her medication or to pandemic related schedule and lifestyle changes Her parents moved to [place] to take care of her grandparents in May which has been hard on her- she misses them a lot   She is taking effexor 150 She is not feeling anxious but is more depressed than she was in the past Her anxiety used to be a big issue for her so she does feel the Effexor is helping her in this way  She is not getting into any issues at work due to concentration problems However she may have a harder time getting out of bed in the morning, notes that she has a harder time concentrating on reading for pleasure which is a big interest for her  No SI  She still enjoys her hobbies, but she is feeling sad more than she might otherwise   She got some labs done at work recently which showed some improvement in her chl per her report  Patient Active Problem List   Diagnosis Date Noted   Avulsion fracture of ankle  05/13/2017   Patient overweight 11/28/2015   Tinnitus 11/28/2015    Past Medical History:  Diagnosis Date   Blood in stool    Frequent headaches    Migraines    Urinary tract infection     Past Surgical History:  Procedure Laterality Date   WISDOM TOOTH EXTRACTION      Social History   Tobacco Use   Smoking status: Never   Smokeless tobacco: Never  Substance Use Topics   Alcohol use: Yes    Alcohol/week: 0.0 standard drinks   Drug use: No    Family History  Problem Relation Age of Onset   Alcoholism Father    Arthritis Father    Hypertension Father    Arthritis Paternal Grandfather    Hypertension Paternal Grandfather    Diabetes Paternal Grandfather    Arthritis Paternal Grandmother    Diabetes Maternal Grandfather    Diabetes Maternal Grandmother     No Known Allergies  Medication list has been reviewed and updated.  Current Outpatient Medications on File Prior to Visit  Medication Sig Dispense Refill   pantoprazole (PROTONIX) 40 MG tablet TAKE 1 TABLET(40 MG) BY MOUTH DAILY 30 tablet 1   venlafaxine XR (EFFEXOR-XR) 150 MG 24 hr capsule Take 1 capsule (150 mg total) by mouth  daily with breakfast. 30 capsule 0   No current facility-administered medications on file prior to visit.    Review of Systems:  As per HPI- otherwise negative. Patient states no current concern for pregnancy  Physical Examination: Vitals:   03/05/21 1337  BP: 134/82  Pulse: 84  Resp: 18  Temp: 98.2 F (36.8 C)  SpO2: 98%   Vitals:   03/05/21 1337  Weight: 252 lb 6.4 oz (114.5 kg)  Height: 5\' 5"  (1.651 m)   Body mass index is 42 kg/m. Ideal Body Weight: Weight in (lb) to have BMI = 25: 149.9  GEN: no acute distress.  Obese, looks well HEENT: Atraumatic, Normocephalic.  Bilateral TM wnl, oropharynx normal.  PEERL,EOMI.   Ears and Nose: No external deformity. CV: RRR, No M/G/R. No JVD. No thrill. No extra heart sounds. PULM: CTA B, no wheezes, crackles, rhonchi. No  retractions. No resp. distress. No accessory muscle use. ABD: S, NT, ND. No rebound. No HSM. EXTR: No c/c/e PSYCH: Normally interactive. Conversant.    Assessment and Plan: Physical exam  Screening for hyperlipidemia - Plan: Lipid panel  Screening for thyroid disorder - Plan: TSH  Screening for diabetes mellitus - Plan: Comprehensive metabolic panel, Hemoglobin A1c  Transaminitis - Plan: Comprehensive metabolic panel, Hepatitis B surface antibody,quantitative, Hepatitis B surface antigen, Hepatitis C antibody  Screening for deficiency anemia - Plan: CBC  Fatigue, unspecified type - Plan: TSH, VITAMIN D 25 Hydroxy (Vit-D Deficiency, Fractures)  GAD (generalized anxiety disorder) - Plan: citalopram (CELEXA) 20 MG tablet  Patient seen today for a physical exam Will plan further follow- up pending labs. Encouraged healthy diet and exercise routine She is currently taking Effexor but notes increase in her depression symptoms since her parents moved away.  We will have her taper off Effexor and start on citalopram 20.  She will let me know how this works for her  Signed , MD  Received her labs 10/13- message to pt  Results for orders placed or performed in visit on 03/05/21  CBC  Result Value Ref Range   WBC 9.2 4.0 - 10.5 K/uL   RBC 4.74 3.87 - 5.11 Mil/uL   Platelets 282.0 150.0 - 400.0 K/uL   Hemoglobin 14.1 12.0 - 15.0 g/dL   HCT 05/05/21 87.8 - 67.6 %   MCV 89.1 78.0 - 100.0 fl   MCHC 33.4 30.0 - 36.0 g/dL   RDW 72.0 94.7 - 09.6 %  Comprehensive metabolic panel  Result Value Ref Range   Sodium 138 135 - 145 mEq/L   Potassium 4.0 3.5 - 5.1 mEq/L   Chloride 101 96 - 112 mEq/L   CO2 27 19 - 32 mEq/L   Glucose, Bld 98 70 - 99 mg/dL   BUN 6 6 - 23 mg/dL   Creatinine, Ser 28.3 0.40 - 1.20 mg/dL   Total Bilirubin 0.7 0.2 - 1.2 mg/dL   Alkaline Phosphatase 76 39 - 117 U/L   AST 23 0 - 37 U/L   ALT 32 0 - 35 U/L   Total Protein 7.2 6.0 - 8.3 g/dL   Albumin  4.5 3.5 - 5.2 g/dL   GFR 6.62 94.76 mL/min   Calcium 9.6 8.4 - 10.5 mg/dL  Hemoglobin >54.65  Result Value Ref Range   Hgb A1c MFr Bld 5.1 4.6 - 6.5 %  Hepatitis B surface antibody,quantitative  Result Value Ref Range   Hepatitis B-Post <5 (L) > OR = 10 mIU/mL  Hepatitis B surface antigen  Result  Value Ref Range   Hepatitis B Surface Ag NON-REACTIVE NON-REACTIVE  Hepatitis C antibody  Result Value Ref Range   Hepatitis C Ab NON-REACTIVE NON-REACTIVE   SIGNAL TO CUT-OFF <0.02 <1.00  Lipid panel  Result Value Ref Range   Cholesterol 261 (H) 0 - 200 mg/dL   Triglycerides 814.4 (H) 0.0 - 149.0 mg/dL   HDL 81.85 (L) >63.14 mg/dL   VLDL 97.0 (H) 0.0 - 26.3 mg/dL   Total CHOL/HDL Ratio 9    NonHDL 232.02   TSH  Result Value Ref Range   TSH 3.04 0.35 - 5.50 uIU/mL  VITAMIN D 25 Hydroxy (Vit-D Deficiency, Fractures)  Result Value Ref Range   VITD 18.56 (L) 30.00 - 100.00 ng/mL  LDL cholesterol, direct  Result Value Ref Range   Direct LDL 170.0 mg/dL

## 2021-03-02 NOTE — Patient Instructions (Addendum)
Good to see you again today- I will be in touch with your labs asap  Let's try tapering off Effexor and then start Celexa Take Effexor every other day for 4-5 doses- then stop and start on Celexa/ citalopram.  Let me know if you have any difficulty!  We can increase Celexa to 40 mg if needed after a few weeks

## 2021-03-04 ENCOUNTER — Other Ambulatory Visit: Payer: Self-pay

## 2021-03-05 ENCOUNTER — Ambulatory Visit (INDEPENDENT_AMBULATORY_CARE_PROVIDER_SITE_OTHER): Payer: Managed Care, Other (non HMO) | Admitting: Family Medicine

## 2021-03-05 VITALS — BP 134/82 | HR 84 | Temp 98.2°F | Resp 18 | Ht 65.0 in | Wt 252.4 lb

## 2021-03-05 DIAGNOSIS — R5383 Other fatigue: Secondary | ICD-10-CM

## 2021-03-05 DIAGNOSIS — Z13220 Encounter for screening for lipoid disorders: Secondary | ICD-10-CM | POA: Diagnosis not present

## 2021-03-05 DIAGNOSIS — Z0000 Encounter for general adult medical examination without abnormal findings: Secondary | ICD-10-CM

## 2021-03-05 DIAGNOSIS — R7401 Elevation of levels of liver transaminase levels: Secondary | ICD-10-CM

## 2021-03-05 DIAGNOSIS — Z131 Encounter for screening for diabetes mellitus: Secondary | ICD-10-CM | POA: Diagnosis not present

## 2021-03-05 DIAGNOSIS — Z130 Encounter for screening for diseases of the blood and blood-forming organs and certain disorders involving the immune mechanism: Secondary | ICD-10-CM | POA: Diagnosis not present

## 2021-03-05 DIAGNOSIS — F411 Generalized anxiety disorder: Secondary | ICD-10-CM

## 2021-03-05 DIAGNOSIS — Z1329 Encounter for screening for other suspected endocrine disorder: Secondary | ICD-10-CM | POA: Diagnosis not present

## 2021-03-05 DIAGNOSIS — E559 Vitamin D deficiency, unspecified: Secondary | ICD-10-CM

## 2021-03-05 MED ORDER — CITALOPRAM HYDROBROMIDE 20 MG PO TABS: 20.0000 mg | ORAL_TABLET | Freq: Every day | ORAL | 4 refills | Status: DC

## 2021-03-06 ENCOUNTER — Encounter: Payer: Self-pay | Admitting: Family Medicine

## 2021-03-06 LAB — COMPREHENSIVE METABOLIC PANEL
ALT: 32 U/L (ref 0–35)
AST: 23 U/L (ref 0–37)
Albumin: 4.5 g/dL (ref 3.5–5.2)
Alkaline Phosphatase: 76 U/L (ref 39–117)
BUN: 6 mg/dL (ref 6–23)
CO2: 27 mEq/L (ref 19–32)
Calcium: 9.6 mg/dL (ref 8.4–10.5)
Chloride: 101 mEq/L (ref 96–112)
Creatinine, Ser: 0.9 mg/dL (ref 0.40–1.20)
GFR: 85.22 mL/min (ref >60.00)
Glucose, Bld: 98 mg/dL (ref 70–99)
Potassium: 4 mEq/L (ref 3.5–5.1)
Sodium: 138 mEq/L (ref 135–145)
Total Bilirubin: 0.7 mg/dL (ref 0.2–1.2)
Total Protein: 7.2 g/dL (ref 6.0–8.3)

## 2021-03-06 LAB — HEPATITIS B SURFACE ANTIBODY, QUANTITATIVE: Hep B S AB Quant (Post): <5 m[IU]/mL — ABNORMAL LOW (ref >=10)

## 2021-03-06 LAB — CBC
HCT: 42.2 % (ref 36.0–46.0)
Hemoglobin: 14.1 g/dL (ref 12.0–15.0)
MCHC: 33.4 g/dL (ref 30.0–36.0)
MCV: 89.1 fl (ref 78.0–100.0)
Platelets: 282 10*3/uL (ref 150.0–400.0)
RBC: 4.74 Mil/uL (ref 3.87–5.11)
RDW: 13.2 % (ref 11.5–15.5)
WBC: 9.2 10*3/uL (ref 4.0–10.5)

## 2021-03-06 LAB — VITAMIN D 25 HYDROXY (VIT D DEFICIENCY, FRACTURES): VITD: 18.56 ng/mL — ABNORMAL LOW (ref 30.00–100.00)

## 2021-03-06 LAB — LDL CHOLESTEROL, DIRECT: Direct LDL: 170 mg/dL

## 2021-03-06 LAB — HEPATITIS C ANTIBODY
Hepatitis C Ab: NONREACTIVE
SIGNAL TO CUT-OFF: <0.02 (ref <1.00)

## 2021-03-06 LAB — HEMOGLOBIN A1C: Hgb A1c MFr Bld: 5.1 % (ref 4.6–6.5)

## 2021-03-06 LAB — LIPID PANEL
Cholesterol: 261 mg/dL — ABNORMAL HIGH (ref 0–200)
HDL: 29 mg/dL — ABNORMAL LOW (ref >39.00)
NonHDL: 232.02
Total CHOL/HDL Ratio: 9
Triglycerides: 374 mg/dL — ABNORMAL HIGH (ref 0.0–149.0)
VLDL: 74.8 mg/dL — ABNORMAL HIGH (ref 0.0–40.0)

## 2021-03-06 LAB — HEPATITIS B SURFACE ANTIGEN: Hepatitis B Surface Ag: NONREACTIVE

## 2021-03-06 LAB — TSH: TSH: 3.04 u[IU]/mL (ref 0.35–5.50)

## 2021-03-06 MED ORDER — VITAMIN D3 1.25 MG (50000 UT) PO CAPS: ORAL_CAPSULE | ORAL | 0 refills | Status: DC

## 2021-03-06 NOTE — Addendum Note (Signed)
Addended by: Abbe Amsterdam C on: 03/06/2021 06:53 PM   Modules accepted: Orders

## 2021-03-20 ENCOUNTER — Other Ambulatory Visit: Payer: Self-pay | Admitting: Family Medicine

## 2021-03-20 DIAGNOSIS — K219 Gastro-esophageal reflux disease without esophagitis: Secondary | ICD-10-CM

## 2021-03-27 ENCOUNTER — Other Ambulatory Visit: Payer: Self-pay | Admitting: Family Medicine

## 2021-03-27 DIAGNOSIS — F411 Generalized anxiety disorder: Secondary | ICD-10-CM

## 2021-06-09 IMAGING — US US ABDOMEN LIMITED
1 series · 14 of 25 positions shown · non-contrast
Comparison: Abdominal ultrasound 06/10/2017

CLINICAL DATA: Transaminitis.

EXAM:
ULTRASOUND ABDOMEN LIMITED RIGHT UPPER QUADRANT

[Series 1: us abdomen limited · 0.31mm/px · 14 of 47 slices shown]
[im 1/47]
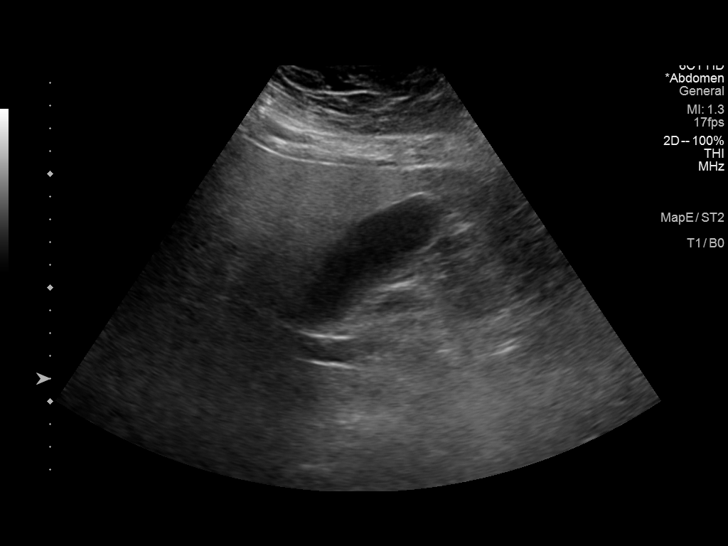
[im 4/47]
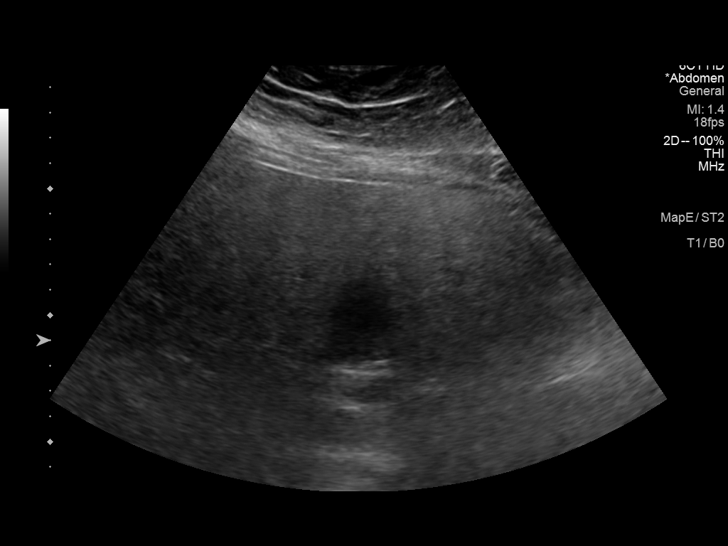
[im 8/47]
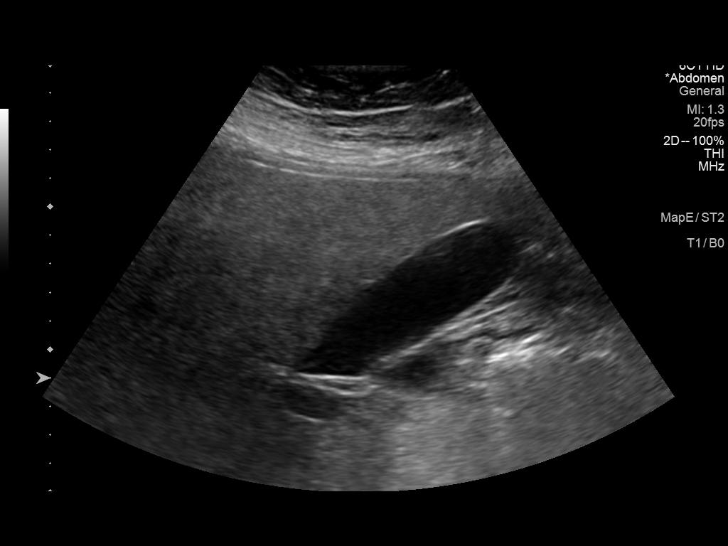
[im 12/47]
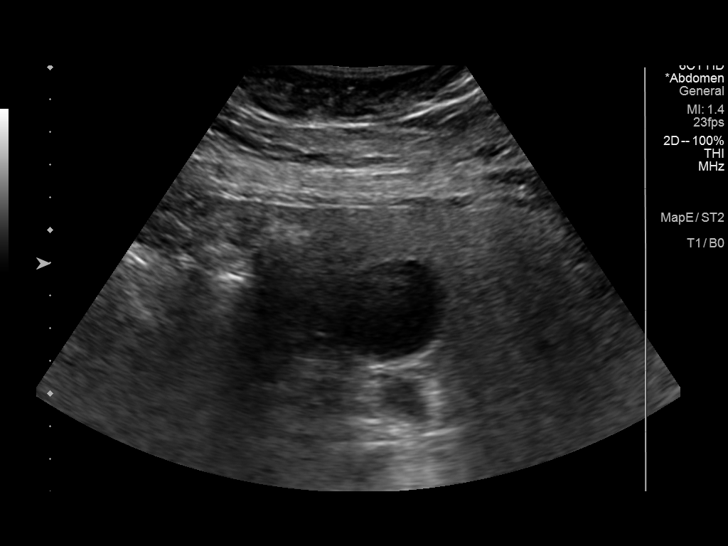
[im 16/47]
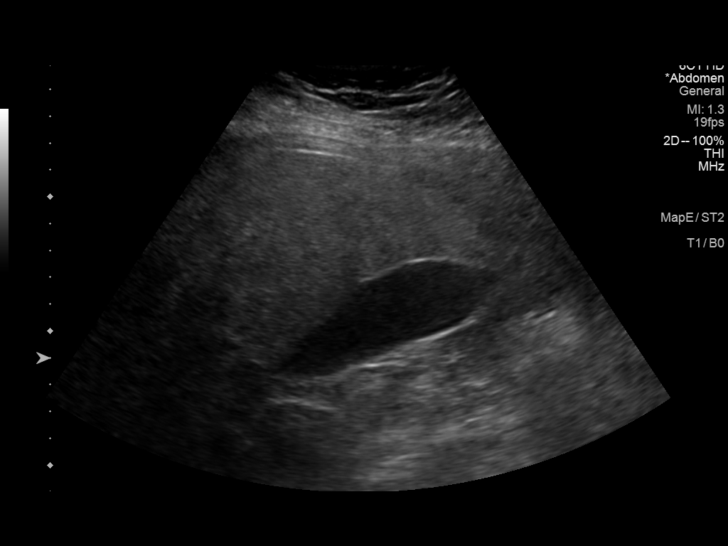
[im 18/47]
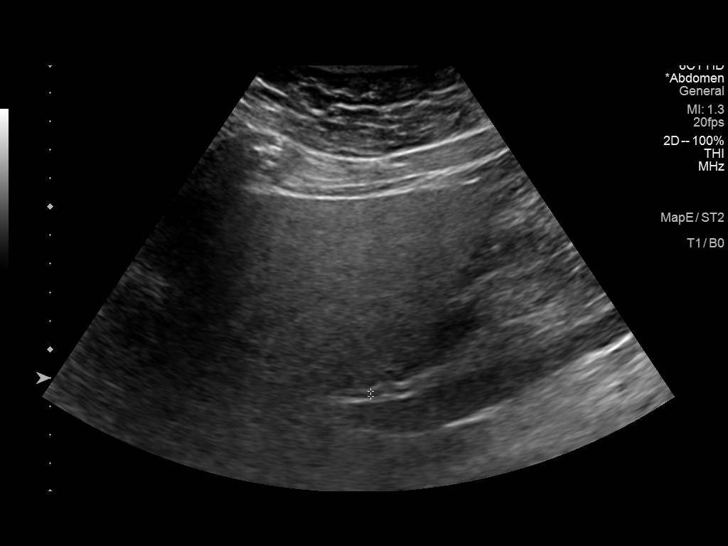
[im 22/47]
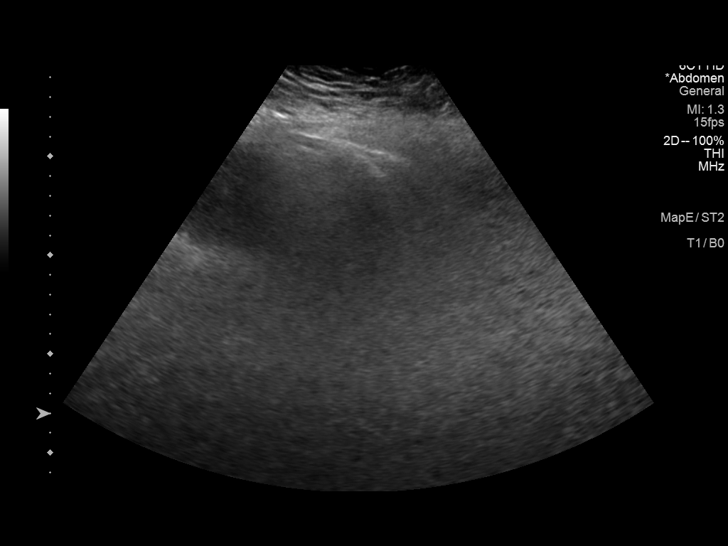
[im 25/47]
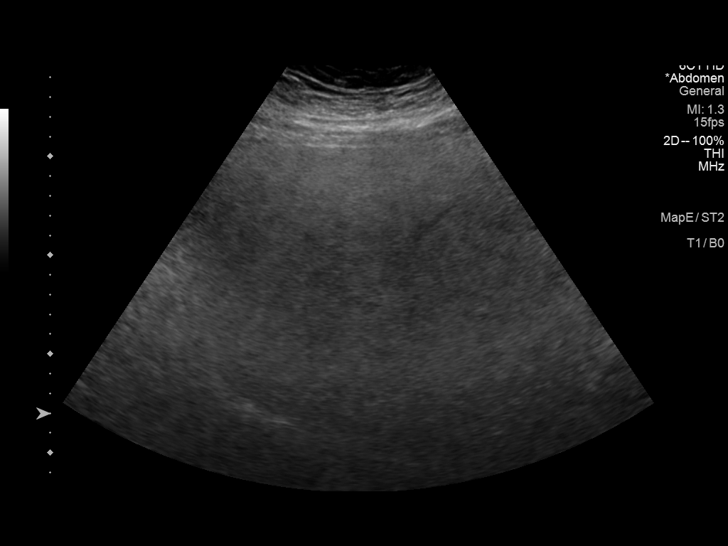
[im 29/47]
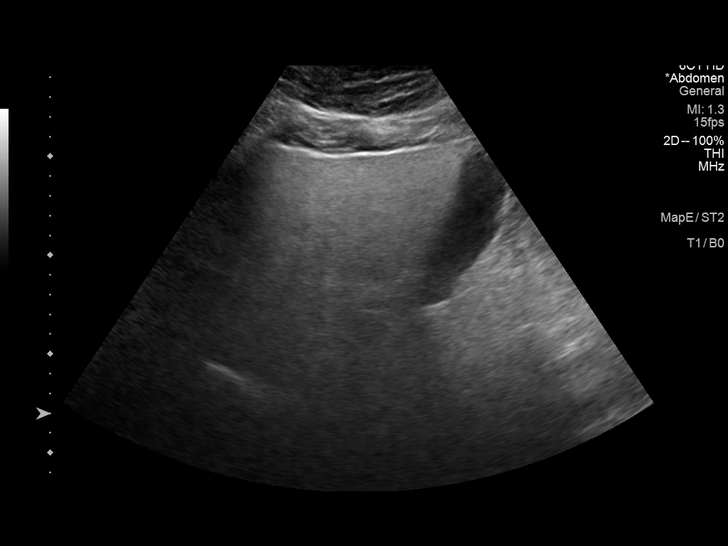
[im 31/47]
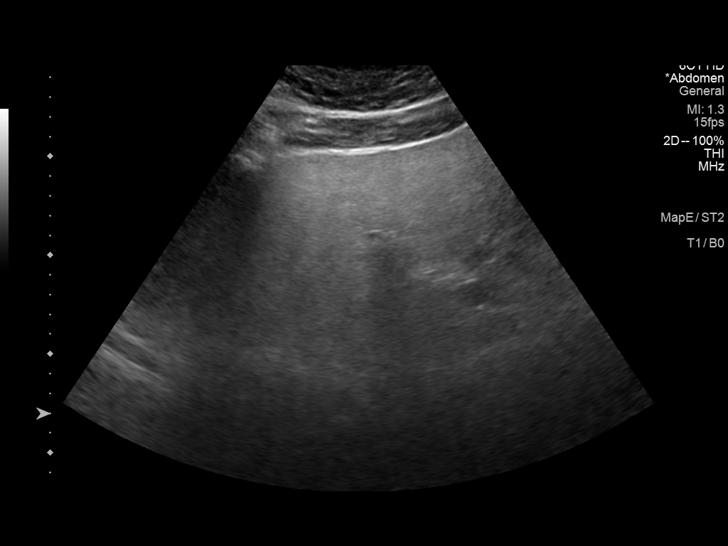
[im 35/47]
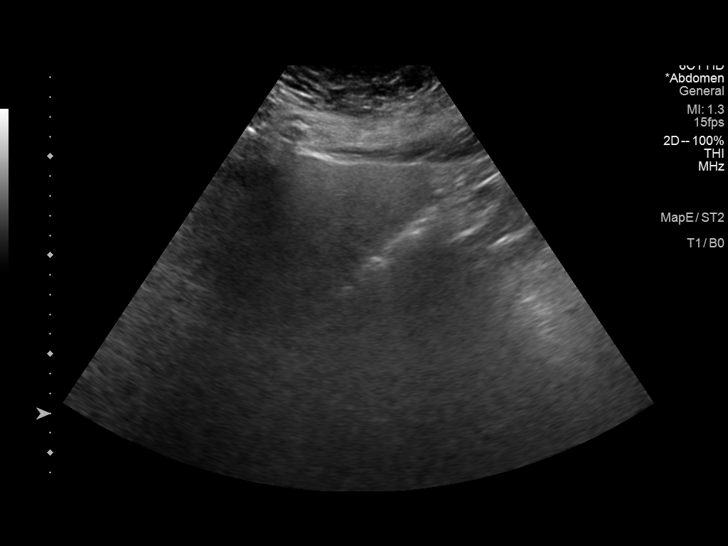
[im 39/47]
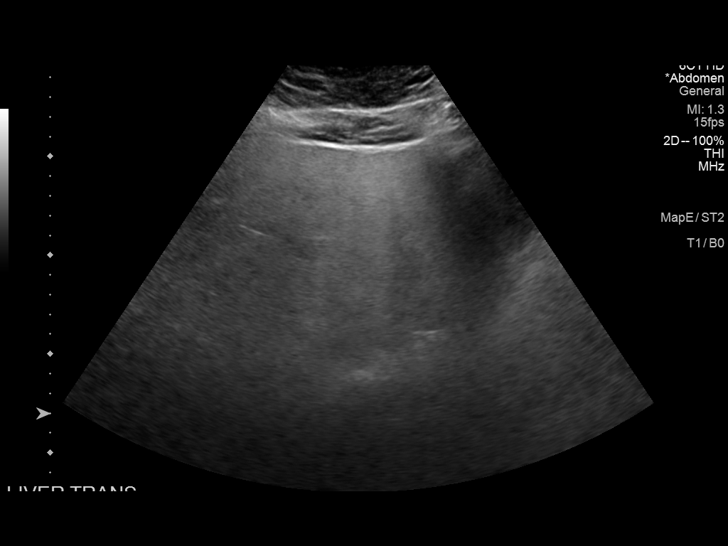
[im 43/47]
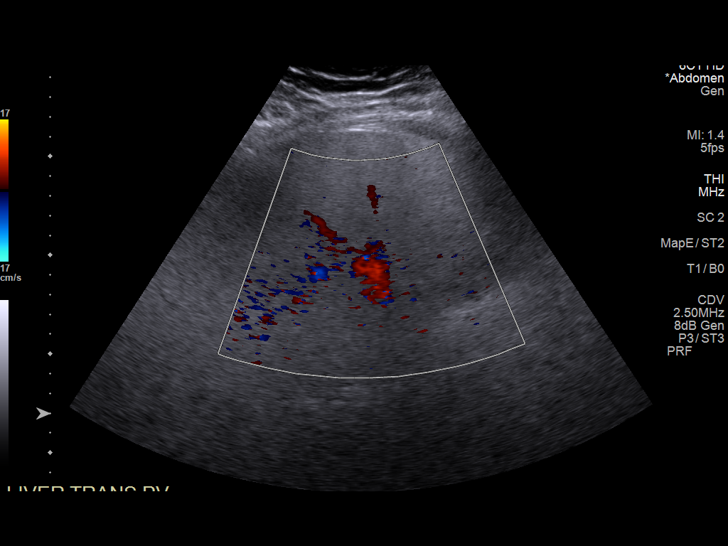
[im 47/47]
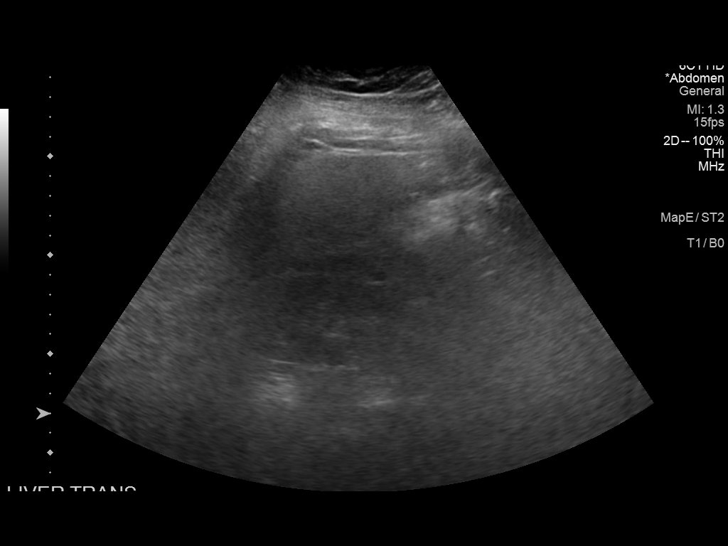

[14 of 25 positions shown; findings below may reference images not displayed]

FINDINGS: Gallbladder:

No gallstones or wall thickening visualized. No sonographic Murphy
sign noted by sonographer.

Common bile duct:

Diameter: 0.2 cm, within normal limits.

Liver:

The liver parenchymal echogenicity is diffusely increased which
limits evaluation. No definite focal lesion identified. Portal vein
is patent on color Doppler imaging with normal direction of blood
flow towards the liver, though difficult to visualize.

Other: None.
IMPRESSION: Diffusely increased liver parenchymal echogenicity most commonly
seen with hepatic steatosis though differential in considerations
include hepatitis and early cirrhosis. Overall poor visualization of
the liver due to the increased echogenicity.

## 2021-06-18 ENCOUNTER — Other Ambulatory Visit: Payer: Self-pay | Admitting: Family Medicine

## 2021-06-18 DIAGNOSIS — K219 Gastro-esophageal reflux disease without esophagitis: Secondary | ICD-10-CM

## 2021-07-23 ENCOUNTER — Other Ambulatory Visit: Payer: Self-pay | Admitting: Family Medicine

## 2021-07-23 DIAGNOSIS — F411 Generalized anxiety disorder: Secondary | ICD-10-CM

## 2021-10-23 ENCOUNTER — Other Ambulatory Visit: Payer: Self-pay | Admitting: Family Medicine

## 2021-10-23 DIAGNOSIS — K219 Gastro-esophageal reflux disease without esophagitis: Secondary | ICD-10-CM

## 2021-12-31 ENCOUNTER — Other Ambulatory Visit: Payer: Self-pay | Admitting: Family Medicine

## 2021-12-31 DIAGNOSIS — F411 Generalized anxiety disorder: Secondary | ICD-10-CM

## 2022-04-08 ENCOUNTER — Other Ambulatory Visit: Payer: Self-pay | Admitting: Family Medicine

## 2022-04-08 DIAGNOSIS — K219 Gastro-esophageal reflux disease without esophagitis: Secondary | ICD-10-CM

## 2022-05-07 ENCOUNTER — Other Ambulatory Visit: Payer: Self-pay | Admitting: Family Medicine

## 2022-05-07 DIAGNOSIS — F411 Generalized anxiety disorder: Secondary | ICD-10-CM

## 2022-06-11 ENCOUNTER — Other Ambulatory Visit: Payer: Self-pay | Admitting: Family Medicine

## 2022-06-11 DIAGNOSIS — F411 Generalized anxiety disorder: Secondary | ICD-10-CM

## 2022-07-03 ENCOUNTER — Encounter: Payer: Self-pay | Admitting: Family Medicine

## 2022-07-03 DIAGNOSIS — F411 Generalized anxiety disorder: Secondary | ICD-10-CM

## 2022-07-03 MED ORDER — CITALOPRAM HYDROBROMIDE 20 MG PO TABS: 20.0000 mg | ORAL_TABLET | Freq: Every day | ORAL | 1 refills | Status: DC

## 2022-07-14 ENCOUNTER — Telehealth: Payer: Self-pay | Admitting: Family Medicine

## 2022-07-14 NOTE — Telephone Encounter (Signed)
LVM to r/s appt- provider at capacity for CPEs and needs to be shifted down or r/s to another day.

## 2022-07-18 NOTE — Progress Notes (Unsigned)
[place] at [facility] [address] [place], [place] [address] [phone] [phone]  Date:  07/22/2022   Name:  Joanna Pena   DOB:  1990-10-02   MRN:  [id number]  PCP:  Darreld Mclean, MD    Chief Complaint: No chief complaint on file.   History of Present Illness:  Joanna Pena is a 32 y.o. very pleasant female patient who presents with the following:  Pt seen today for a CPE Last seen by myself in October of 2022- at that time she was taking effexor but was having more depression sx, we changed her over to celexa   Pap Flu shot Covid booster Labs can be updated   Patient Active Problem List   Diagnosis Date Noted   Avulsion fracture of ankle 05/13/2017   Patient overweight 11/28/2015   Tinnitus 11/28/2015    Past Medical History:  Diagnosis Date   Blood in stool    Frequent headaches    Migraines    Urinary tract infection     Past Surgical History:  Procedure Laterality Date   WISDOM TOOTH EXTRACTION      Social History   Tobacco Use   Smoking status: Never   Smokeless tobacco: Never  Substance Use Topics   Alcohol use: Yes    Alcohol/week: 0.0 standard drinks of alcohol   Drug use: No    Family History  Problem Relation Age of Onset   Alcoholism Father    Arthritis Father    Hypertension Father    Arthritis Paternal Grandfather    Hypertension Paternal Grandfather    Diabetes Paternal Grandfather    Arthritis Paternal Grandmother    Diabetes Maternal Grandfather    Diabetes Maternal Grandmother     No Known Allergies  Medication list has been reviewed and updated.  Current Outpatient Medications on File Prior to Visit  Medication Sig Dispense Refill   Cholecalciferol (VITAMIN D3) 1.25 MG (50000 UT) CAPS Take 1 weekly for 12 weeks 12 capsule 0   citalopram (CELEXA) 20 MG tablet Take 1 tablet (20 mg total) by mouth daily. 30 tablet 1   pantoprazole (PROTONIX) 40 MG tablet TAKE 1 TABLET(40  MG) BY MOUTH DAILY 30 tablet 3   No current facility-administered medications on file prior to visit.    Review of Systems:  As per HPI- otherwise negative.   Physical Examination: There were no vitals filed for this visit. There were no vitals filed for this visit. There is no height or weight on file to calculate BMI. Ideal Body Weight:    GEN: no acute distress. HEENT: Atraumatic, Normocephalic.  Ears and Nose: No external deformity. CV: RRR, No M/G/R. No JVD. No thrill. No extra heart sounds. PULM: CTA B, no wheezes, crackles, rhonchi. No retractions. No resp. distress. No accessory muscle use. ABD: S, NT, ND, +BS. No rebound. No HSM. EXTR: No c/c/e PSYCH: Normally interactive. Conversant.    Assessment and Plan: *** Physical exam today- encouraged healthy diet and exercise routine Will plan further follow- up pending labs.  Signed Lamar Blinks, MD

## 2022-07-18 NOTE — Patient Instructions (Incomplete)
Good to see you again today- I will be in touch with your labs asap  Let's have you taper off of Celexa, take 1/2 tablet daily for 5 to 7 days and then stop.  Following this you can start on buspirone.  Start with 1/2 tablet, 7.5 mg twice a day.  You can increase this to 15 mg twice a day over the course of 2 to 3 weeks as needed  It is okay to take your pantoprazole long-term if needed.  However, you might see if an over-the-counter H2 blocker would control your GERD symptoms Tagamet HB (cimetidine) Pepcid Complete or Pepcid AC (famotidine) Axid AR (nizatidine)  An H2 blocker is more ideal for long-term treatment

## 2022-07-20 ENCOUNTER — Encounter: Payer: Managed Care, Other (non HMO) | Admitting: Family Medicine

## 2022-07-22 ENCOUNTER — Ambulatory Visit (INDEPENDENT_AMBULATORY_CARE_PROVIDER_SITE_OTHER): Payer: Managed Care, Other (non HMO) | Admitting: Family Medicine

## 2022-07-22 ENCOUNTER — Encounter: Payer: Self-pay | Admitting: Family Medicine

## 2022-07-22 ENCOUNTER — Encounter: Payer: Managed Care, Other (non HMO) | Admitting: Family Medicine

## 2022-07-22 VITALS — BP 138/88 | HR 87 | Temp 98.4°F | Resp 12 | Ht 65.5 in | Wt 263.2 lb

## 2022-07-22 DIAGNOSIS — Z131 Encounter for screening for diabetes mellitus: Secondary | ICD-10-CM

## 2022-07-22 DIAGNOSIS — Z13220 Encounter for screening for lipoid disorders: Secondary | ICD-10-CM

## 2022-07-22 DIAGNOSIS — E559 Vitamin D deficiency, unspecified: Secondary | ICD-10-CM

## 2022-07-22 DIAGNOSIS — K219 Gastro-esophageal reflux disease without esophagitis: Secondary | ICD-10-CM

## 2022-07-22 DIAGNOSIS — Z1329 Encounter for screening for other suspected endocrine disorder: Secondary | ICD-10-CM

## 2022-07-22 DIAGNOSIS — F411 Generalized anxiety disorder: Secondary | ICD-10-CM

## 2022-07-22 DIAGNOSIS — R7401 Elevation of levels of liver transaminase levels: Secondary | ICD-10-CM | POA: Diagnosis not present

## 2022-07-22 DIAGNOSIS — Z130 Encounter for screening for diseases of the blood and blood-forming organs and certain disorders involving the immune mechanism: Secondary | ICD-10-CM | POA: Diagnosis not present

## 2022-07-22 DIAGNOSIS — D72829 Elevated white blood cell count, unspecified: Secondary | ICD-10-CM

## 2022-07-22 DIAGNOSIS — Z0000 Encounter for general adult medical examination without abnormal findings: Secondary | ICD-10-CM

## 2022-07-22 MED ORDER — PANTOPRAZOLE SODIUM 40 MG PO TBEC: DELAYED_RELEASE_TABLET | ORAL | 3 refills | Status: DC

## 2022-07-22 MED ORDER — BUSPIRONE HCL 15 MG PO TABS: 15.0000 mg | ORAL_TABLET | Freq: Two times a day (BID) | ORAL | 5 refills | Status: AC

## 2022-07-23 ENCOUNTER — Encounter: Payer: Self-pay | Admitting: Family Medicine

## 2022-07-23 LAB — TSH: TSH: 2.63 u[IU]/mL (ref 0.35–5.50)

## 2022-07-23 LAB — LDL CHOLESTEROL, DIRECT: Direct LDL: 162 mg/dL

## 2022-07-23 LAB — LIPID PANEL
Cholesterol: 246 mg/dL — ABNORMAL HIGH (ref 0–200)
HDL: 34.8 mg/dL — ABNORMAL LOW (ref >39.00)
NonHDL: 211.63
Total CHOL/HDL Ratio: 7
Triglycerides: 313 mg/dL — ABNORMAL HIGH (ref 0.0–149.0)
VLDL: 62.6 mg/dL — ABNORMAL HIGH (ref 0.0–40.0)

## 2022-07-23 LAB — COMPREHENSIVE METABOLIC PANEL
ALT: 34 U/L (ref 0–35)
AST: 20 U/L (ref 0–37)
Albumin: 4.5 g/dL (ref 3.5–5.2)
Alkaline Phosphatase: 65 U/L (ref 39–117)
BUN: 9 mg/dL (ref 6–23)
CO2: 30 mEq/L (ref 19–32)
Calcium: 10.1 mg/dL (ref 8.4–10.5)
Chloride: 99 mEq/L (ref 96–112)
Creatinine, Ser: 0.95 mg/dL (ref 0.40–1.20)
GFR: 79.1 mL/min (ref >60.00)
Glucose, Bld: 110 mg/dL — ABNORMAL HIGH (ref 70–99)
Potassium: 4.2 mEq/L (ref 3.5–5.1)
Sodium: 139 mEq/L (ref 135–145)
Total Bilirubin: 0.7 mg/dL (ref 0.2–1.2)
Total Protein: 7.2 g/dL (ref 6.0–8.3)

## 2022-07-23 LAB — CBC
HCT: 44.1 % (ref 36.0–46.0)
Hemoglobin: 14.9 g/dL (ref 12.0–15.0)
MCHC: 33.9 g/dL (ref 30.0–36.0)
MCV: 89.7 fl (ref 78.0–100.0)
Platelets: 326 10*3/uL (ref 150.0–400.0)
RBC: 4.92 Mil/uL (ref 3.87–5.11)
RDW: 13.4 % (ref 11.5–15.5)
WBC: 11.5 10*3/uL — ABNORMAL HIGH (ref 4.0–10.5)

## 2022-07-23 LAB — HEMOGLOBIN A1C: Hgb A1c MFr Bld: 5.3 % (ref 4.6–6.5)

## 2022-07-23 LAB — VITAMIN D 25 HYDROXY (VIT D DEFICIENCY, FRACTURES): VITD: 16.11 ng/mL — ABNORMAL LOW (ref 30.00–100.00)

## 2022-07-23 MED ORDER — VITAMIN D3 1.25 MG (50000 UT) PO CAPS: ORAL_CAPSULE | ORAL | 0 refills | Status: AC

## 2022-07-23 NOTE — Addendum Note (Signed)
Addended by: Lamar Blinks C on: 07/23/2022 03:09 PM   Modules accepted: Orders

## 2022-08-06 ENCOUNTER — Encounter: Payer: Self-pay | Admitting: Family Medicine

## 2022-08-06 MED ORDER — HYDROXYZINE HCL 25 MG PO TABS: 25.0000 mg | ORAL_TABLET | Freq: Three times a day (TID) | ORAL | 1 refills | Status: AC | PRN

## 2022-08-06 NOTE — Addendum Note (Signed)
Addended by: Lamar Blinks C on: 08/06/2022 02:12 PM   Modules accepted: Orders

## 2023-08-15 ENCOUNTER — Other Ambulatory Visit: Payer: Self-pay | Admitting: Family Medicine

## 2023-08-15 DIAGNOSIS — K219 Gastro-esophageal reflux disease without esophagitis: Secondary | ICD-10-CM

## 2023-08-18 ENCOUNTER — Other Ambulatory Visit: Payer: Self-pay | Admitting: Family Medicine

## 2023-08-18 ENCOUNTER — Encounter: Payer: Self-pay | Admitting: Family Medicine

## 2023-08-18 DIAGNOSIS — E559 Vitamin D deficiency, unspecified: Secondary | ICD-10-CM
